# Patient Record
Sex: Male | Born: 1940 | Race: White | Hispanic: No | Marital: Married | Smoking: Former smoker
Health system: Southern US, Community
[De-identification: ages and names within clinical notes are randomized; demographics above are authoritative.]

## PROBLEM LIST (undated history)

## (undated) DIAGNOSIS — E785 Hyperlipidemia, unspecified: Secondary | ICD-10-CM

## (undated) DIAGNOSIS — K219 Gastro-esophageal reflux disease without esophagitis: Secondary | ICD-10-CM

## (undated) DIAGNOSIS — E559 Vitamin D deficiency, unspecified: Secondary | ICD-10-CM

## (undated) DIAGNOSIS — I10 Essential (primary) hypertension: Secondary | ICD-10-CM

## (undated) HISTORY — DX: Vitamin D deficiency, unspecified: E55.9

## (undated) HISTORY — PX: TONSILLECTOMY AND ADENOIDECTOMY: SUR1326

## (undated) HISTORY — DX: Essential (primary) hypertension: I10

## (undated) HISTORY — DX: Hyperlipidemia, unspecified: E78.5

## (undated) HISTORY — DX: Gastro-esophageal reflux disease without esophagitis: K21.9

## (undated) HISTORY — PX: APPENDECTOMY: SHX54

---

## 2000-01-12 ENCOUNTER — Ambulatory Visit (HOSPITAL_COMMUNITY): Admission: RE | Admit: 2000-01-12 | Discharge: 2000-01-12 | Payer: Self-pay | Admitting: Internal Medicine

## 2000-01-12 ENCOUNTER — Encounter: Payer: Self-pay | Admitting: Internal Medicine

## 2000-01-26 ENCOUNTER — Encounter (INDEPENDENT_AMBULATORY_CARE_PROVIDER_SITE_OTHER): Payer: Self-pay | Admitting: Specialist

## 2000-01-26 ENCOUNTER — Ambulatory Visit (HOSPITAL_COMMUNITY): Admission: RE | Admit: 2000-01-26 | Discharge: 2000-01-26 | Payer: Self-pay | Admitting: Internal Medicine

## 2001-08-07 ENCOUNTER — Encounter: Admission: RE | Admit: 2001-08-07 | Discharge: 2001-08-07 | Payer: Self-pay | Admitting: Family Medicine

## 2001-08-07 ENCOUNTER — Encounter: Payer: Self-pay | Admitting: Family Medicine

## 2006-01-13 ENCOUNTER — Ambulatory Visit (HOSPITAL_COMMUNITY): Admission: RE | Admit: 2006-01-13 | Discharge: 2006-01-13 | Payer: Self-pay | Admitting: Internal Medicine

## 2006-01-23 ENCOUNTER — Ambulatory Visit (HOSPITAL_COMMUNITY): Admission: RE | Admit: 2006-01-23 | Discharge: 2006-01-23 | Payer: Self-pay | Admitting: Internal Medicine

## 2006-04-27 ENCOUNTER — Ambulatory Visit (HOSPITAL_COMMUNITY): Admission: RE | Admit: 2006-04-27 | Discharge: 2006-04-27 | Payer: Self-pay | Admitting: Internal Medicine

## 2007-10-26 ENCOUNTER — Observation Stay (HOSPITAL_COMMUNITY): Admission: EM | Admit: 2007-10-26 | Discharge: 2007-10-27 | Payer: Self-pay | Admitting: Emergency Medicine

## 2007-11-22 LAB — HM COLONOSCOPY

## 2008-09-22 ENCOUNTER — Ambulatory Visit (HOSPITAL_COMMUNITY): Admission: RE | Admit: 2008-09-22 | Discharge: 2008-09-22 | Payer: Self-pay | Admitting: Internal Medicine

## 2008-11-21 HISTORY — PX: HERNIA REPAIR: SHX51

## 2009-02-13 ENCOUNTER — Ambulatory Visit (HOSPITAL_BASED_OUTPATIENT_CLINIC_OR_DEPARTMENT_OTHER): Admission: RE | Admit: 2009-02-13 | Discharge: 2009-02-13 | Payer: Self-pay | Admitting: Surgery

## 2009-06-21 IMAGING — CT CT CHEST W/O CM
3 series · 18 of 30 positions shown, 19 images · non-contrast
Comparison: 04/27/2006 and 01/23/2006

CLINICAL DATA: Followup right upper lobe nodule.  Post inflammatory
changes in the lungs.

CT CHEST WITHOUT CONTRAST
TECHNIQUE: Multidetector CT imaging of the chest was performed
following the standard protocol without IV contrast.

[Series 2: chest w/ · axial · 0.66mm/px · z∈[-280,-90]mm · 4 of 77 slices shown, 5 images]
[im 20/77  mediastinal]
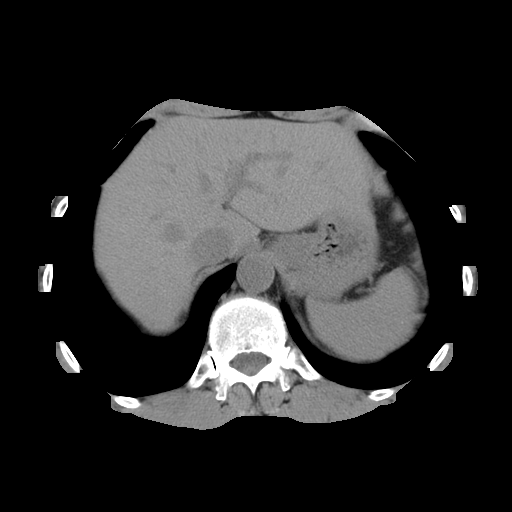
[im 20/77  lung]
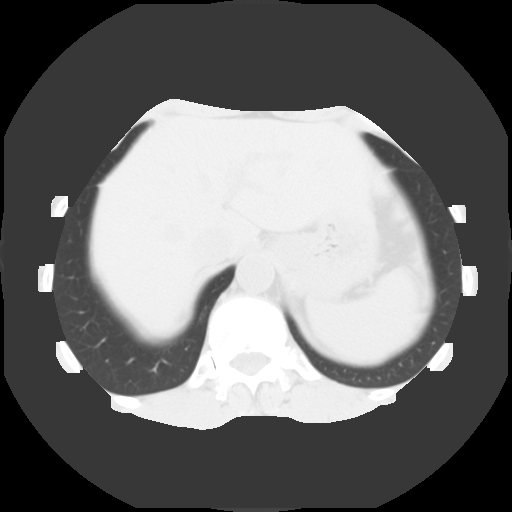
[im 38/77  lung]
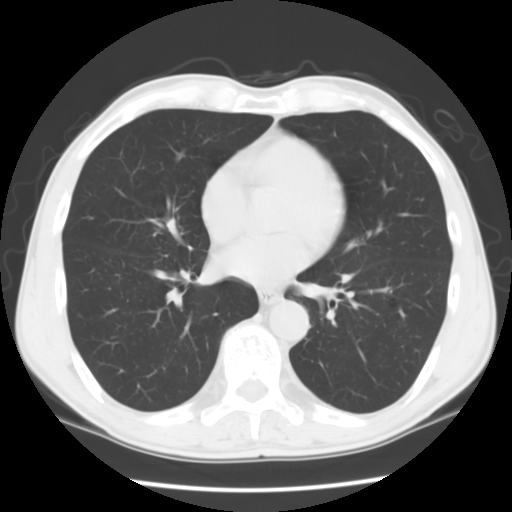
[im 39/77  lung]
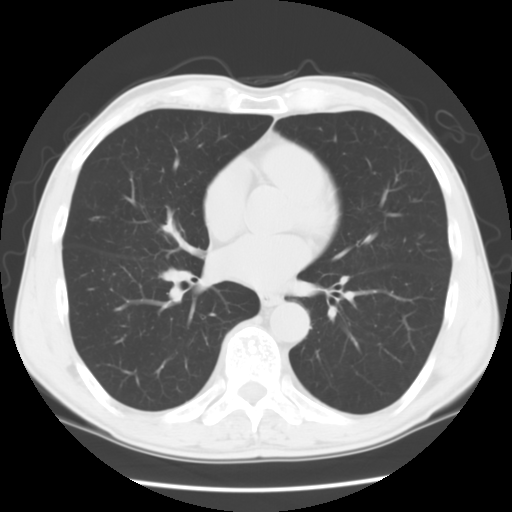
[im 58/77  lung]
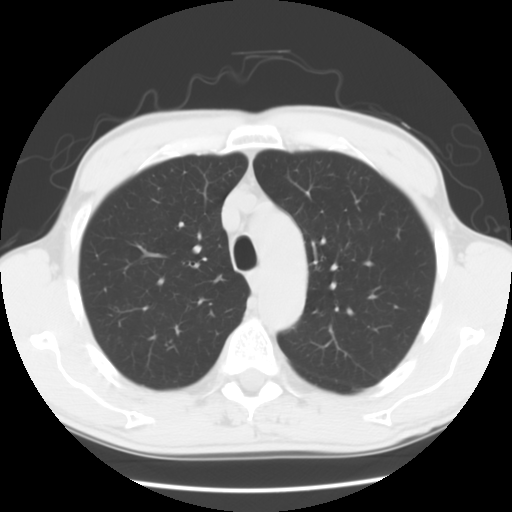

[Series 400: reformatted · coronal · 0.76mm/px · 6 of 148 slices shown (1 of 2)]
[im 15/148  lung]
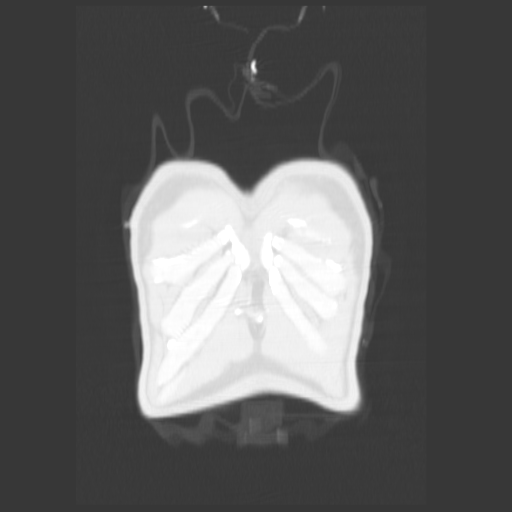
[im 30/148  lung]
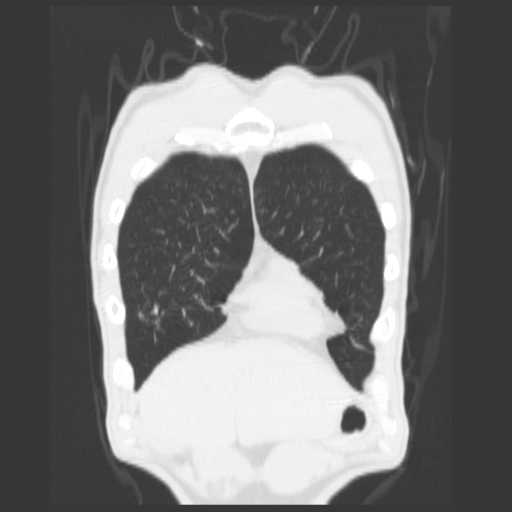
[im 45/148  lung]
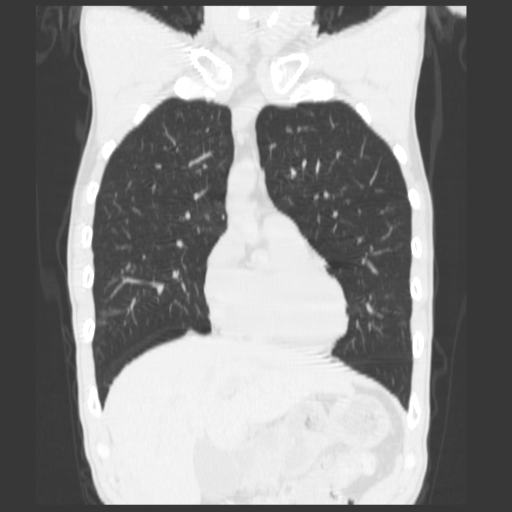
[im 59/148  lung]
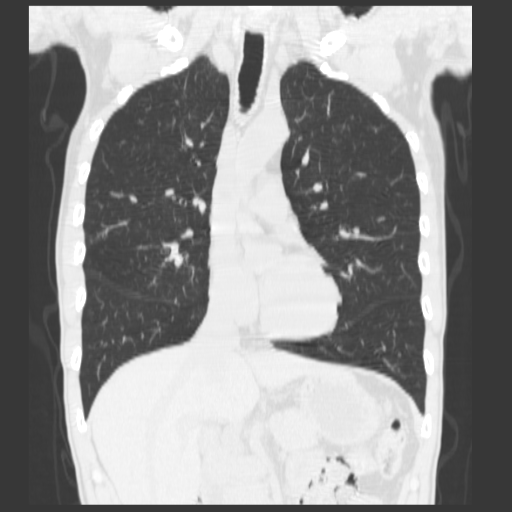
[im 89/148  lung]
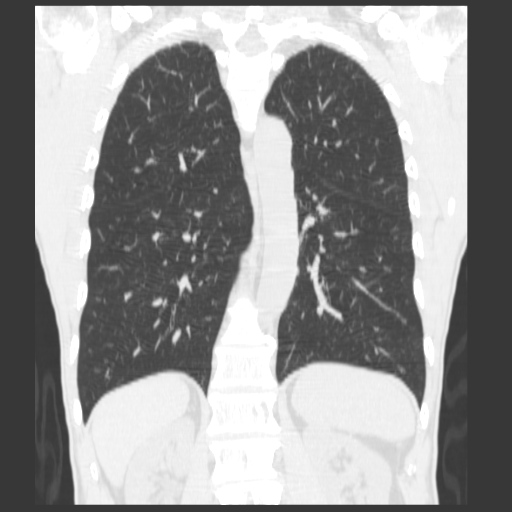
[im 103/148  lung]
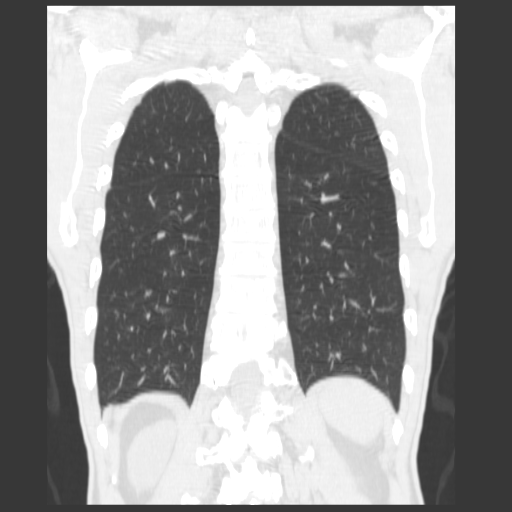

[Series 401: reformatted · sagittal · 0.76mm/px · 8 of 166 slices shown (2 of 2)]
[im 16/166  lung]
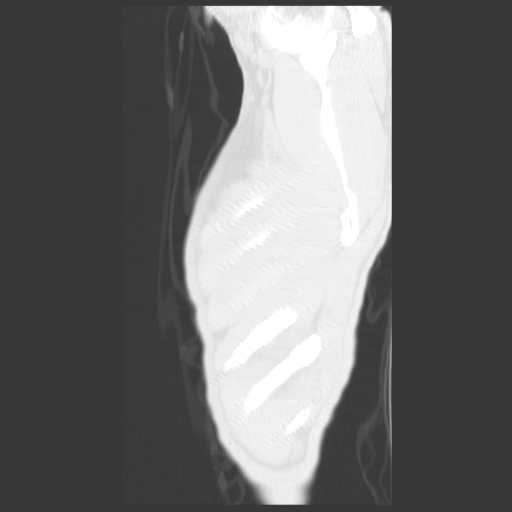
[im 46/166  lung]
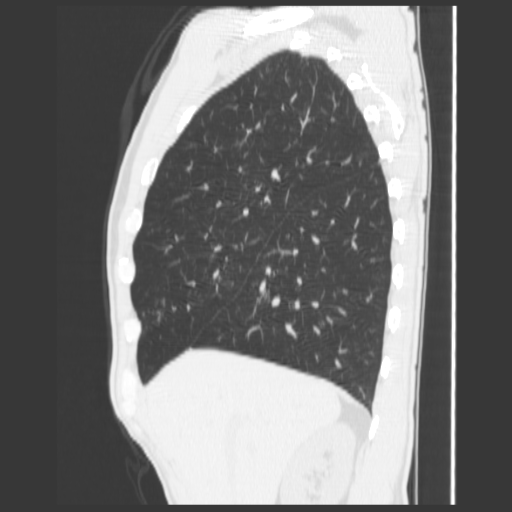
[im 61/166  lung]
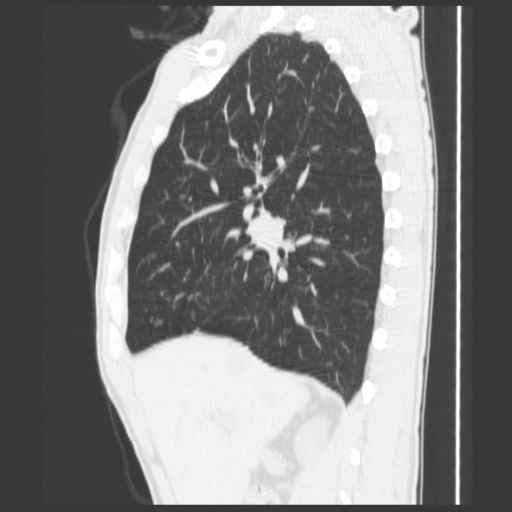
[im 76/166  lung]
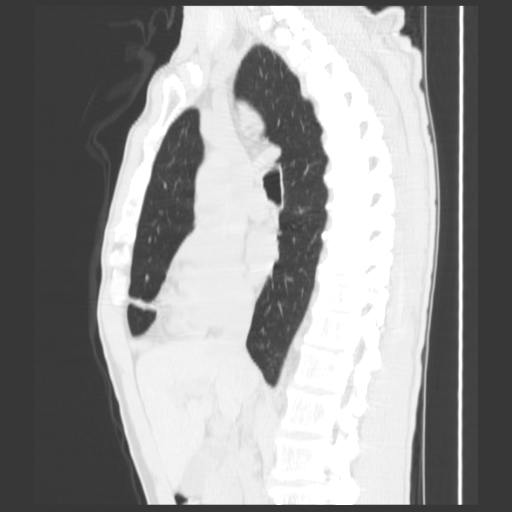
[im 91/166  lung]
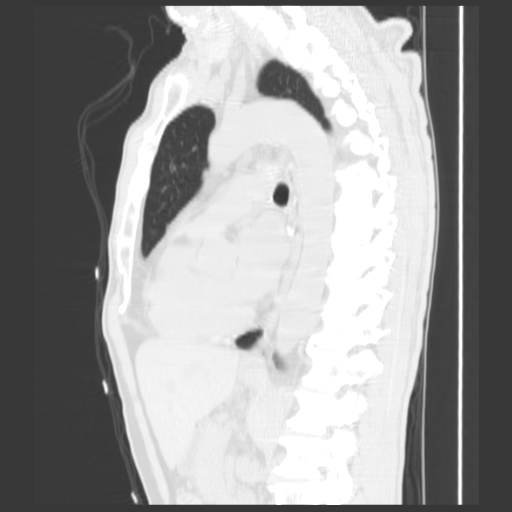
[im 106/166  lung]
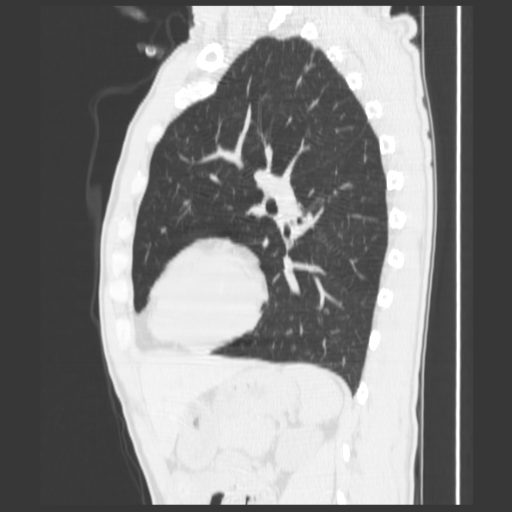
[im 121/166  lung]
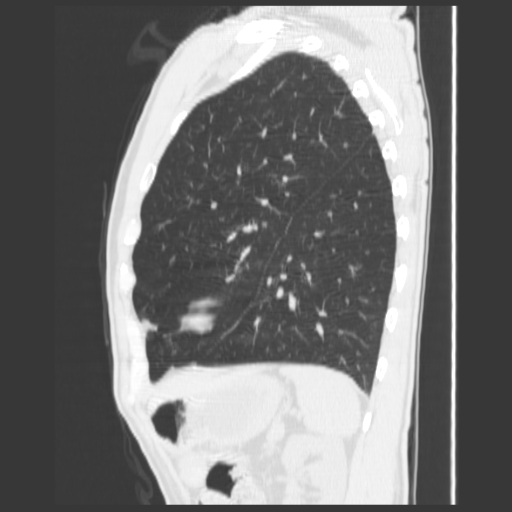
[im 151/166  lung]
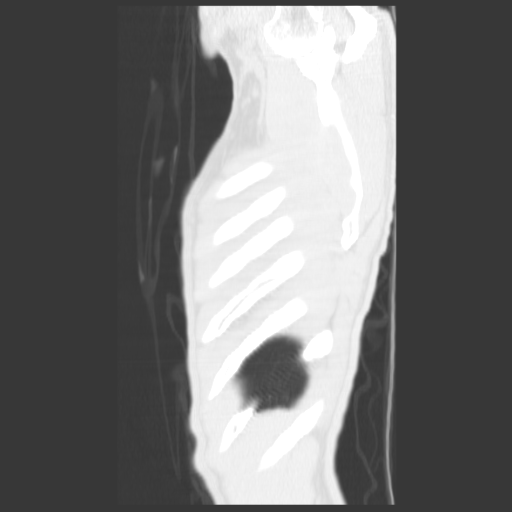

[18 of 30 positions shown; findings below may reference images not displayed]

FINDINGS: No pathologically enlarged mediastinal, hilar or axillary
lymph nodes.  Heart size normal.  No pericardial effusion.

Mild biapical scarring.  Scarring in the right middle lobe and
posterior segment right upper lobe is likely post infectious in
etiology.  Appearance is stable.  Lungs otherwise clear.  No
pleural fluid.  Airway unremarkable.

Incidental imaging of the upper abdomen shows no acute findings.
No worrisome lytic or sclerotic lesions.
IMPRESSION: 1.  No acute findings. No pulmonary nodules.
2.  Scar in the right upper and right middle lobe is likely post
infectious in etiology.

## 2011-03-03 LAB — POCT HEMOGLOBIN-HEMACUE: Hemoglobin: 14.9 g/dL (ref 13.0–17.0)

## 2011-04-05 NOTE — Op Note (Signed)
NAMEMALCOMB, GANGEMI              ACCOUNT NO.:  [id number]   MEDICAL RECORD NO.:  [id number]          PATIENT TYPE:  AMB   LOCATION:  DSC                          FACILITY:  [facility]   PHYSICIAN:  Thornton Park. Daphine Deutscher, MD  DATE OF BIRTH:  [birth year]   DATE OF PROCEDURE:  02/13/2009  DATE OF DISCHARGE:                               OPERATIVE REPORT   PREOPERATIVE DIAGNOSIS:  A 70 year old man with a right inguinal hernia.   POSTOPERATIVE DIAGNOSIS:  Right indirect inguinal hernia.   PROCEDURE:  Right inguinal herniorrhaphy with UltraPro II mesh.   SURGEON:  Thornton Park. Daphine Deutscher, MD   ANESTHESIA:  General by LMA.   DESCRIPTION OF PROCEDURE:  Mr. Mullane was taken to room 8 on Friday,  February 13, 2009 and given general anesthesia.  The abdomen was prepped  with Technicare equivalent after being clipped and was draped sterilely.  A small oblique incision was made and carried down to the external  oblique.  These were incised along the fibers.  The cord was mobilized  and a Penrose drain placed about it.  I dissected free a fairly  prominent indirect sac and I then performed a high ligation with 2-0  silk.  Vascular structures to the testicle were not disturbed and no  neurovascular bundles were transected that could be identified.  The  floor was then repaired with a piece of UltraPro II mesh sutured along  the inguinal ligament and medially with again 2-0 Prolene.  It was then  sutured to itself around the cord and tucked beneath the external  oblique.  The external oblique was closed with running 2-0 Vicryl.  The  patient tolerated the procedure well.  The wound was subsequently closed  with 4-0 Vicryl with a running subcuticular 5-0 Monocryl with Dermabond.  The patient was taken to the recovery room.  He will be given Tylox to  take for pain with followup in the office in 3-4 weeks.      Thornton Park Daphine Deutscher, MD  Electronically Signed     MBM/MEDQ  D:  02/13/2009  T:  02/14/2009   Job:  [id number]   cc:   Lovenia Kim, D.O.

## 2011-04-05 NOTE — H&P (Signed)
Carlos Green, Carlos Green              ACCOUNT NO.:  [id number]   MEDICAL RECORD NO.:  [id number]          PATIENT TYPE:  EMS   LOCATION:  MAJO                         FACILITY:  [facility]   PHYSICIAN:  Lonia Blood, M.D.       DATE OF BIRTH:  [birth year]   DATE OF ADMISSION:  10/26/2007  DATE OF DISCHARGE:                              HISTORY & PHYSICAL   PRIMARY CARE PHYSICIAN:  Lovenia Kim, D.O.   CHIEF COMPLAINT:  Chest pain.   HISTORY OF PRESENT ILLNESS:  Mr. Morina is a 70 year old gentleman with  a past medical history esophagitis, who came today to the emergency room  after he had an episode of 10/10 epigastric/inferior retrosternal chest  pain that was constant burning-like sensation.  He vomited promptly  coffee-ground-appearing emesis and then the pain got better.  The  patient denies any sweating or shortness of breath when the episode  occurred.  He reports that the pain did not radiate.  The pain was worse  with deep breathing.  The patient carries no personal history of  coronary artery disease and he does not have a strong family history of  early coronary disease.  The patient does not have diabetes and he does  not smoke cigarettes.   PAST MEDICAL HISTORY:  Significant for some esophagitis, hemorrhoids,  inguinal hernia and appendectomy.   HOME MEDICATIONS:  None.   DRUG ALLERGIES:  No known drug allergies.   SOCIAL HISTORY:  The patient is unemployed.  He drinks 4-5 cups of  coffee a day.  He has a remote history of alcohol use, quit 40 years  ago.  Remote history of tobacco use, quit 15 years ago.  The patient is  married and has two children.   FAMILY HISTORY:  Positive for alcoholism in his father, positive asthma  in mother.  Both parents are deceased.  The patient has two brothers.  They are both healthy.   REVIEW OF SYSTEMS:  Positive for the nausea and the vomiting.  Positive  for one loose bowel movement.  The patient also reports some blurry  vision.   Also other systems are per HPI.  All other systems reviewed and  negative.   PHYSICAL EXAM UPON ADMISSION:  Temperature 97.6, pulse 60, respiration  16, blood pressure is 149/85.  GENERAL APPEARANCE:  A thin, elderly male in no acute distress.  He is  alert, oriented to place, person and time.  Head appears normocephalic, atraumatic.  Eyes:  Pupils equal, round,  react to light and accommodation.  Extraocular movements intact.  The  patient has alopecia areata.  NECK:  Supple.  No JVD.  No carotid bruits.  RESPIRATORY:  Clear to auscultation bilaterally without wheezes, rhonchi  or crackles.  HEART:  Regular rate and rhythm without murmurs, rubs or gallops.  ABDOMEN:  Soft, nontender, nondistended.  Bowel sounds are present.  Reducible right inguinal hernia.  GENITOURINARY:  The patient has no CVA tenderness.  EXTREMITIES:  Without edema.  SKIN:  Warm, dry, without any suspicious rashes.  NEUROLOGIC:  Cranial nerves III-XII are intact.  Sensation is  intact  strength 5/5 in all four extremities.  Gait is intact.   LABORATORY VALUES AT TIME OF ADMISSION:  White blood cell count is 6.6,  hemoglobin is 14.5, platelet count is 170.  Sodium is 138, potassium  4.3, chloride 103, bicarb 28, BUN 11, creatinine 1, glucose 110.  Urinalysis within normal limits.  EKG shows sinus bradycardia and no ST-  T changes.  Chest x-ray:  Some emphysema.  No active disease.   ASSESSMENT/PLAN:  1. Chest pain, unclear etiology but that we would favor      gastrointestinal etiology given history of esophagitis and episode      of nausea and vomiting.  The patient, though, is at the right age      and he has risk factors for coronary disease because he is a man      and because of his age.  The cholesterol status is unknown.  The      patient has a remote history of tobacco use but does not have a      strong family history of coronary artery disease.  First set of      cardiac enzymes is normal.  An EKG  does not have worrisome      findings.  The patient merits 23 hours' observation.  Telemetric      monitoring will be obtained.  A fasting lipid panel will be      obtained.  Given the patient's bradycardia, thyroid status will be      assessed.  Proton pump inhibitors will be used to treat the patient      and aspirin for coronary prevention.  Depending on the observation      over the next 23 hours, the patient will have either an outpatient      stress test versus inpatient evaluation.  A trial of proton pump      inhibitors will be done regardless of the results of the cardiac      workup.      Lonia Blood, M.D.  Electronically Signed     SL/MEDQ  D:  10/26/2007  T:  10/27/2007  Job:  [id number]   cc:   Lovenia Kim, D.O.

## 2011-04-08 NOTE — Discharge Summary (Signed)
NAMEJERIKO, KOWALKE              ACCOUNT NO.:  [id number]   MEDICAL RECORD NO.:  [id number]          PATIENT TYPE:  OBV   LOCATION:  3733                         FACILITY:  [facility]   PHYSICIAN:  Lonia Blood, M.D.       DATE OF BIRTH:  21-Oct-1941   DATE OF ADMISSION:  10/26/2007  DATE OF DISCHARGE:  10/27/2007                               DISCHARGE SUMMARY   PRIMARY CARE PHYSICIAN:  Dr. Marisue Brooklyn.   DISCHARGE DIAGNOSES:  1. Chest pain - unclear etiology.  2. Gastroesophageal reflux disease.  3. Esophagitis.   DISCHARGE MEDICATIONS:  1. Aspirin 81 mg daily.  2. Prilosec OTC 40 mg daily.   CONDITION ON DISCHARGE:  Mr. Grygiel was discharged in good condition.  The patient was instructed to follow up with his primary care physician  as needed.  The patient was also referred to the [facility] for constellation of an outpatient stress test.  The patient was  told that he would be contacted by the office, to schedule a visit with  [facility].   PROCEDURES DURING THIS ADMISSION:  October 26, 2007, the patient  underwent chest x-ray findings of COPD and no active disease.   CONSULTATION:  During this admission, no consultations obtained.   HISTORY AND PHYSICAL:  For admission history and physical, refer to  dictated H&P done by Dr. Lavera Guise, October 26, 2007.   HOSPITAL COURSE:  1. Chest pain.  Mr. Lange was admitted through the emergency room      with complaints of retrosternal chest pain.  The history indicated      the possibility of a gastrointestinal etiology.  Despite this,      given the patient's age, we elected to observe him overnight to      rule out possibility of unstable angina/myocardial infarction.  Mr.      Cancio      remained without any chest pain through the night, without      arrhythmias on EKG, and had three sets of normal cardiac enzymes.      The patient was instructed to resume his proton pump inhibitor, and      he was advised  to follow up with cardiology as an outpatient for      stress test.      Lonia Blood, M.D.  Electronically Signed     SL/MEDQ  D:  11/01/2007  T:  11/01/2007  Job:  [id number]   cc:   Lovenia Kim, D.O.

## 2011-04-08 NOTE — Procedures (Signed)
[name]  Patient:    Carlos Green, Carlos Green                     MRN: [id number] Adm. Date:  [id number] Attending:  Mervin Hack CC:         Carlos Green, M.D. [facility]                           Procedure Report  PROCEDURE PERFORMED:  Upper endoscopy and colonoscopy  INDICATION FOR PROCEDURE:  This 70 year old gentleman has complained of epigastric pain, belching, burning and symptoms from gastroesophageal reflux.  He also has had intermittent bright red blood per rectum and described tarry stools as well for  past two to three years. He has had rectal bleeding up until this morning when e prepped for colonoscopy.  On physical examination on January 12, 2000 he had tenderness in the epigastrium. His hemoglobin was 15.9.  He has no family history of colon cancer.  UPPER ENDOSCOPY  ENDOSCOPE:  Fujinon single channel videoscope  SEDATIONS:  Versed 8 mg IV  FINDINGS: Esophagus: Fujinon single channel videoendoscope passed under direction through posterior pharynx into esophagus.  The patient was monitored by pulse oximetry.  His oxygen saturations were normal.  Proximal mid esophagus was normal. Distal esophageal mucosa was patchy showing increased fibrosis as well as erythema but there were no discreet erosions.  There was a demarcation at the Z line showed mild increase of the fibrous tissue and somewhat irregular Z line.  Biopsies were taken from this area to rule out Barretts esophagus.  There was a small reducible hiatal hernia.  Stomach:  Stomach was insufflated with air and showed normal gastric mucosa, antrum as well as body of the stomach.  Pyloric acid was also normal.  Retroflexion of the endoscope revealed normal fundus and cardia.  Duodenum: Duodenal bulb and descending duodenum are normal.  IMPRESSION: 1. Distal esophagitis status post biopsies. 2. Status post CLOtest.  COLONOSCOPY  ENDOSCOPE:  Fujinon single  channel videoscope  SEDATION: Demerol 75 mg IV  FINDINGS:  Fujinon single channel videoendoscope passed through rectum into sigmoid colon.  The patient was again monitored by pulse oximetry.  His oxygen saturations were normal. His prep was excellent.  Anal canal was normal.  There were large internal hemorrhoids especially one them showed ulceration with bleeding. These were visualized by retroflexing the colonoscope into the rectum.  Sigmoid colon  mucosa appeared normal. There were no diverticula.  Mucosal folds were normal. Mucosa throughout the left colon was unremarkable.  Splenic flexure, transverse  colon and hepatic flexure was unremarkable.  Colonoscope passed with ease through the right colon to cecum, cecal valve were normal.  Photographs of the ileocecal valve and cecal patch were obtained.  The colonoscope was then retracted. The colon was decompressed.  The patient tolerated the procedure well.  IMPRESSION:  Bleeding hemorrhoids.  PLAN: As far as his epigastric pain is concerned the patient is showing signs of chronic gastroesophageal reflux.  We will increase his Prevacid to 30 mg twice  day.  Await results of the biopsies and the CLOtest.  His hemorrhoids will be treated again conservatively with Anusol suppositories twice a day, high fiber iet and rectal care.  If the bleeding continues, he may need banding or injection of the hemorrhoids. DD:  01/26/00 TD:  01/27/00 Job: [id number]

## 2011-08-29 LAB — URINALYSIS, ROUTINE W REFLEX MICROSCOPIC
Hgb urine dipstick: NEGATIVE
Ketones, ur: NEGATIVE
Nitrite: NEGATIVE
Protein, ur: NEGATIVE
Specific Gravity, Urine: 1.005
Urobilinogen, UA: 0.2
pH: 7

## 2011-08-29 LAB — DIFFERENTIAL
Basophils Absolute: 0
Eosinophils Relative: 1
Lymphocytes Relative: 10 — ABNORMAL LOW
Monocytes Absolute: 0.3
Monocytes Relative: 4
Neutro Abs: 5.6
Neutrophils Relative %: 85 — ABNORMAL HIGH

## 2011-08-29 LAB — POCT CARDIAC MARKERS
CKMB, poc: 1.1
Myoglobin, poc: 73.2
Troponin i, poc: <0.05

## 2011-08-29 LAB — CBC
HCT: 43.3
MCHC: 33
MCV: 87.9
MCV: 89.5
Platelets: 170
RBC: 4.52
WBC: 5.4

## 2011-08-29 LAB — BASIC METABOLIC PANEL
BUN: 11
CO2: 26
Calcium: 8.6
Creatinine, Ser: 1.02
GFR calc Af Amer: >60
Glucose, Bld: 94
Potassium: 3.9

## 2011-08-29 LAB — COMPREHENSIVE METABOLIC PANEL
CO2: 28
Glucose, Bld: 110 — ABNORMAL HIGH
Potassium: 4.3
Total Bilirubin: 0.9

## 2011-08-29 LAB — LIPID PANEL: Triglycerides: 41

## 2011-08-29 LAB — D-DIMER, QUANTITATIVE: D-Dimer, Quant: <0.22

## 2011-08-29 LAB — TROPONIN I: Troponin I: 0.01

## 2011-08-29 LAB — CARDIAC PANEL(CRET KIN+CKTOT+MB+TROPI): Troponin I: 0.02

## 2013-10-21 ENCOUNTER — Encounter: Payer: Self-pay | Admitting: Physician Assistant

## 2013-10-24 ENCOUNTER — Encounter: Payer: Self-pay | Admitting: Physician Assistant

## 2013-10-24 ENCOUNTER — Ambulatory Visit (HOSPITAL_COMMUNITY)
Admission: RE | Admit: 2013-10-24 | Discharge: 2013-10-24 | Disposition: A | Discharge status: Home / Self Care | Payer: Medicare Other | Source: Ambulatory Visit | Attending: Physician Assistant | Admitting: Physician Assistant

## 2013-10-24 ENCOUNTER — Ambulatory Visit (INDEPENDENT_AMBULATORY_CARE_PROVIDER_SITE_OTHER): Payer: Medicare Other | Admitting: Physician Assistant

## 2013-10-24 VITALS — BP 122/78 | HR 56 | Temp 97.7°F | Resp 16 | Ht 66.0 in | Wt 142.0 lb

## 2013-10-24 DIAGNOSIS — K219 Gastro-esophageal reflux disease without esophagitis: Secondary | ICD-10-CM

## 2013-10-24 DIAGNOSIS — Z87891 Personal history of nicotine dependence: Secondary | ICD-10-CM | POA: Insufficient documentation

## 2013-10-24 DIAGNOSIS — E785 Hyperlipidemia, unspecified: Secondary | ICD-10-CM

## 2013-10-24 DIAGNOSIS — Z0000 Encounter for general adult medical examination without abnormal findings: Secondary | ICD-10-CM

## 2013-10-24 DIAGNOSIS — I10 Essential (primary) hypertension: Secondary | ICD-10-CM | POA: Insufficient documentation

## 2013-10-24 DIAGNOSIS — Z79899 Other long term (current) drug therapy: Secondary | ICD-10-CM

## 2013-10-24 DIAGNOSIS — E559 Vitamin D deficiency, unspecified: Secondary | ICD-10-CM

## 2013-10-24 DIAGNOSIS — Z125 Encounter for screening for malignant neoplasm of prostate: Secondary | ICD-10-CM

## 2013-10-24 DIAGNOSIS — R918 Other nonspecific abnormal finding of lung field: Secondary | ICD-10-CM | POA: Insufficient documentation

## 2013-10-24 LAB — CBC WITH DIFFERENTIAL/PLATELET
Basophils Absolute: 0.1 10*3/uL (ref 0.0–0.1)
Basophils Relative: 1 % (ref 0–1)
Eosinophils Absolute: 0.2 10*3/uL (ref 0.0–0.7)
Eosinophils Relative: 4 % (ref 0–5)
HCT: 43.6 % (ref 39.0–52.0)
Hemoglobin: 15 g/dL (ref 13.0–17.0)
Lymphocytes Relative: 22 % (ref 12–46)
Lymphs Abs: 1.1 10*3/uL (ref 0.7–4.0)
MCH: 29.2 pg (ref 26.0–34.0)
MCHC: 34.4 g/dL (ref 30.0–36.0)
MCV: 84.8 fL (ref 78.0–100.0)
Monocytes Relative: 12 % (ref 3–12)
Neutrophils Relative %: 61 % (ref 43–77)
WBC: 4.8 10*3/uL (ref 4.0–10.5)

## 2013-10-24 LAB — TSH: TSH: 3.783 u[IU]/mL (ref 0.350–4.500)

## 2013-10-24 LAB — BASIC METABOLIC PANEL WITH GFR
CO2: 25 mEq/L (ref 19–32)
Calcium: 9.7 mg/dL (ref 8.4–10.5)
Creat: 0.82 mg/dL (ref 0.50–1.35)
Sodium: 138 mEq/L (ref 135–145)

## 2013-10-24 LAB — HEPATIC FUNCTION PANEL
ALT: 11 U/L (ref 0–53)
Indirect Bilirubin: 0.4 mg/dL (ref 0.0–0.9)

## 2013-10-24 LAB — LIPID PANEL
Cholesterol: 208 mg/dL — ABNORMAL HIGH (ref 0–200)
LDL Cholesterol: 128 mg/dL — ABNORMAL HIGH (ref 0–99)
Total CHOL/HDL Ratio: 3.1 Ratio

## 2013-10-24 LAB — MAGNESIUM: Magnesium: 2 mg/dL (ref 1.5–2.5)

## 2013-10-24 NOTE — Progress Notes (Signed)
Complete Physical HPI Patient presents for complete physical.   Patient's blood pressure has been controlled at home.  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 Patient denies chest pain, shortness of breath, dizziness. Patient's cholesterol is diet controlled. The patient's cholesterol last visit was LDL 103. PSA was 0.67. Vitamin D was 53 and not on a supplement.  Current Medications:  Current Outpatient Prescriptions on File Prior to Visit  Medication Sig Dispense Refill  . vitamin B-12 (CYANOCOBALAMIN) 1000 MCG tablet Take 1,000 mcg by mouth daily.      . vitamin C (ASCORBIC ACID) 500 MG tablet Take 500 mg by mouth daily.       No current facility-administered medications on file prior to visit.   Health Maintenance:  Tetanus: 09/2011 Pneumovax:01/12/2006 Flu vaccine: declines Zostavax: declines DEXA: declines Colonoscopy: 2009 due 2019 EGD: 2009 Allergies: No Known Allergies Medical History:  Past Medical History  Diagnosis Date  . Hyperlipidemia   . GERD (gastroesophageal reflux disease)     esophagitis EGD 2009  . Vitamin D deficiency    Surgical History:  Past Surgical History  Procedure Laterality Date  . Appendectomy    . Hernia repair Right 2010    inguinal  . Tonsillectomy and adenoidectomy     Family History:  Family History  Problem Relation Age of Onset  . Diabetes Mother   . Stroke Mother   . Hypertension Father   . Cancer Sister     breast   Social History:  History   Social History  . Marital Status: Married    Spouse Name: N/A    Number of Children: N/A  . Years of Education: N/A   Occupational History  . Not on file.   Social History Main Topics  . Smoking status: Former Smoker    Quit date: 10/22/1991  . Smokeless tobacco: Never Used  . Alcohol Use: No  . Drug Use: No  . Sexual Activity: Not Currently   Other Topics Concern  . Not on file   Social History Narrative  . No narrative on file    ROS Constitutional: Denies weight loss/gain, headaches, insomnia, fatigue, night sweats, and change in appetite. Eyes: Denies redness, blurred vision, diplopia, discharge, itchy, watery eyes.  ENT: Denies discharge, congestion, post nasal drip, sore throat, earache, hearing loss, dental pain, Tinnitus, Vertigo, Sinus pain, snoring.  Cardio: Denies chest pain, palpitations, irregular heartbeat, dyspnea, diaphoresis, orthopnea, PND, claudication, edema Respiratory: denies cough, dyspnea, pleurisy, hoarseness, wheezing.  Gastrointestinal: (Dr. Juanda Chance) Denies dysphagia, heartburn, pain, cramps, nausea, vomiting, bloating, diarrhea, constipation, hematemesis, melena, hematochezia, hemorrhoids Genitourinary: (Dr. Daphine Deutscher hernia repair) Denies dysuria, frequency, urgency, nocturia, hesitancy, discharge, hematuria, flank pain Breast:Denies Breast lumps, nipple discharge, bleeding.  Musculoskeletal: Denies arthralgia, myalgia, stiffness, Jt. Swelling, pain, Skin: Denies pruritis, rash, hives,  acne, eczema, changing in skin lesion Neuro: Denies Weakness, tremor, incoordination, spasms, paresthesia, pain Psychiatric: Denies confusion, memory loss, sensory loss Endocrine: Denies change in weight, skin, hair change, nocturia, and paresthesia, Diabetic Denies Polys, visual blurring, hyper /hypo glycemic episodes.  Heme/Lymph: Denies Excessive bleeding, bruising, enlarged lymph nodes  Physical Exam: Estimated body mass index is 22.93 kg/(m^2) as calculated from the following:   Height as of this encounter: 5\' 6"  (1.676 m).   Weight as of this encounter: 142 lb (64.411 kg). Filed Vitals:   10/24/13 1012  BP: 122/78  Pulse: 56  Temp: 97.7 F (36.5 C)  Resp: 16   General Appearance: Barrel chest,  slightly malnurished , in no apparent distress. Eyes: PERRLA, EOMs, conjunctiva no swelling or erythema, normal fundi and vessels. Sinuses: No Frontal/maxillary tenderness ENT/Mouth: Ext aud canals clear,  normal light reflex with TMs without erythema, bulging.  Good dentition. No erythema, swelling, or exudate on post pharynx. Tonsils not swollen or erythematous. Hearing normal.  Neck: Supple, thyroid normal. No bruits Respiratory: Respiratory effort normal, BS equal bilaterally without rales, rhonchi, wheezing or stridor. Cardio: Heart sounds normal, regular rate and rhythm without murmurs, rubs or gallops. Peripheral pulses brisk and equal bilaterally, without edema.  Chest: symmetric, with normal excursions and percussion. Abdomen: Flat, soft, with bowl sounds. Non tender, no guarding, rebound, hernias, masses, or organomegaly. .  Lymphatics: Non tender without lymphadenopathy.  Genitourinary: Defer Musculoskeletal: Full ROM all peripheral extremities,5/5 strength, and normal gait. Skin: Warm, dry without rashes, lesions, ecchymosis.  Neuro: Cranial nerves intact, reflexes equal bilaterally. Normal muscle tone, no cerebellar symptoms. Sensation intact.  Psych: Awake and oriented X 3, normal affect, Insight and Judgment appropriate.   EKG: RRR at 56bmp, LAE, PRWP, IRBBB no ST changes.   Assessment and Plan: 1. Hyperlipidemia Continue diet and exercise - Lipid panel  2. GERD (gastroesophageal reflux disease) Controlled diet  3. Vitamin D deficiency Not on supplement check level - Vit D  25 hydroxy (rtn osteoporosis monitoring)  4. Hypertension - CBC with Differential - BASIC METABOLIC PANEL WITH GFR - Hepatic function panel - TSH - Urinalysis, Routine w reflex microscopic - Microalbumin / creatinine urine ratio - EKG 12-Lead - [place], RETROPERITNL ABD,  LTD - DG Chest 2 View; Future- former smoker  5. Special screening for malignant neoplasm of prostate - PSA  6. Encounter for long-term (current) use of other medications - Magnesium    Carlos Green 10:29 AM

## 2013-10-24 NOTE — Patient Instructions (Signed)
Fat and Cholesterol Control Diet  Fat and cholesterol levels in your blood and organs are influenced by your diet. High levels of fat and cholesterol may lead to diseases of the heart, small and large blood vessels, gallbladder, liver, and pancreas.  CONTROLLING FAT AND CHOLESTEROL WITH DIET  Although exercise and lifestyle factors are important, your diet is key. That is because certain foods are known to raise cholesterol and others to lower it. The goal is to balance foods for their effect on cholesterol and more importantly, to replace saturated and trans fat with other types of fat, such as monounsaturated fat, polyunsaturated fat, and omega-3 fatty acids.  On average, a person should consume no more than 15 to 17 g of saturated fat daily. Saturated and trans fats are considered "bad" fats, and they will raise LDL cholesterol. Saturated fats are primarily found in animal products such as meats, butter, and cream. However, that does not mean you need to give up all your favorite foods. Today, there are good tasting, low-fat, low-cholesterol substitutes for most of the things you like to eat. Choose low-fat or nonfat alternatives. Choose round or loin cuts of red meat. These types of cuts are lowest in fat and cholesterol. Chicken (without the skin), fish, veal, and ground turkey breast are great choices. Eliminate fatty meats, such as hot dogs and salami. Even shellfish have little or no saturated fat. Have a 3 oz (85 g) portion when you eat lean meat, poultry, or fish.  Trans fats are also called "partially hydrogenated oils." They are oils that have been scientifically manipulated so that they are solid at room temperature resulting in a longer shelf life and improved taste and texture of foods in which they are added. Trans fats are found in stick margarine, some tub margarines, cookies, crackers, and baked goods.   When baking and cooking, oils are a great substitute for butter. The monounsaturated oils are  especially beneficial since it is believed they lower LDL and raise HDL. The oils you should avoid entirely are saturated tropical oils, such as coconut and palm.   Remember to eat a lot from food groups that are naturally free of saturated and trans fat, including fish, fruit, vegetables, beans, grains (barley, rice, couscous, bulgur wheat), and pasta (without cream sauces).   IDENTIFYING FOODS THAT LOWER FAT AND CHOLESTEROL   Soluble fiber may lower your cholesterol. This type of fiber is found in fruits such as apples, vegetables such as broccoli, potatoes, and carrots, legumes such as beans, peas, and lentils, and grains such as barley. Foods fortified with plant sterols (phytosterol) may also lower cholesterol. You should eat at least 2 g per day of these foods for a cholesterol lowering effect.   Read package labels to identify low-saturated fats, trans fat free, and low-fat foods at the supermarket. Select cheeses that have only 2 to 3 g saturated fat per ounce. Use a heart-healthy tub margarine that is free of trans fats or partially hydrogenated oil. When buying baked goods (cookies, crackers), avoid partially hydrogenated oils. Breads and muffins should be made from whole grains (whole-wheat or whole oat flour, instead of "flour" or "enriched flour"). Buy non-creamy canned soups with reduced salt and no added fats.   FOOD PREPARATION TECHNIQUES   Never deep-fry. If you must fry, either stir-fry, which uses very little fat, or use non-stick cooking sprays. When possible, broil, bake, or roast meats, and steam vegetables. Instead of putting butter or margarine on vegetables, use lemon   and herbs, applesauce, and cinnamon (for squash and sweet potatoes). Use nonfat yogurt, salsa, and low-fat dressings for salads.   LOW-SATURATED FAT / LOW-FAT FOOD SUBSTITUTES  Meats / Saturated Fat (g)  · Avoid: Steak, marbled (3 oz/85 g) / 11 g  · Choose: Steak, lean (3 oz/85 g) / 4 g  · Avoid: Hamburger (3 oz/85 g) / 7  g  · Choose: Hamburger, lean (3 oz/85 g) / 5 g  · Avoid: Ham (3 oz/85 g) / 6 g  · Choose: Ham, lean cut (3 oz/85 g) / 2.4 g  · Avoid: Chicken, with skin, dark meat (3 oz/85 g) / 4 g  · Choose: Chicken, skin removed, dark meat (3 oz/85 g) / 2 g  · Avoid: Chicken, with skin, light meat (3 oz/85 g) / 2.5 g  · Choose: Chicken, skin removed, light meat (3 oz/85 g) / 1 g  Dairy / Saturated Fat (g)  · Avoid: Whole milk (1 cup) / 5 g  · Choose: Low-fat milk, 2% (1 cup) / 3 g  · Choose: Low-fat milk, 1% (1 cup) / 1.5 g  · Choose: Skim milk (1 cup) / 0.3 g  · Avoid: Hard cheese (1 oz/28 g) / 6 g  · Choose: Skim milk cheese (1 oz/28 g) / 2 to 3 g  · Avoid: Cottage cheese, 4% fat (1 cup) / 6.5 g  · Choose: Low-fat cottage cheese, 1% fat (1 cup) / 1.5 g  · Avoid: Ice cream (1 cup) / 9 g  · Choose: Sherbet (1 cup) / 2.5 g  · Choose: Nonfat frozen yogurt (1 cup) / 0.3 g  · Choose: Frozen fruit bar / trace  · Avoid: Whipped cream (1 tbs) / 3.5 g  · Choose: Nondairy whipped topping (1 tbs) / 1 g  Condiments / Saturated Fat (g)  · Avoid: Mayonnaise (1 tbs) / 2 g  · Choose: Low-fat mayonnaise (1 tbs) / 1 g  · Avoid: Butter (1 tbs) / 7 g  · Choose: Extra light margarine (1 tbs) / 1 g  · Avoid: Coconut oil (1 tbs) / 11.8 g  · Choose: Olive oil (1 tbs) / 1.8 g  · Choose: Corn oil (1 tbs) / 1.7 g  · Choose: Safflower oil (1 tbs) / 1.2 g  · Choose: Sunflower oil (1 tbs) / 1.4 g  · Choose: Soybean oil (1 tbs) / 2.4 g  · Choose: Canola oil (1 tbs) / 1 g  Document Released: 11/07/2005 Document Revised: 03/04/2013 Document Reviewed: 04/28/2011  [employer]® Patient Information ©2014 [employer].

## 2013-10-25 LAB — URINALYSIS, ROUTINE W REFLEX MICROSCOPIC
Bilirubin Urine: NEGATIVE
Glucose, UA: NEGATIVE mg/dL
Leukocytes, UA: NEGATIVE
Specific Gravity, Urine: 1.005 (ref 1.005–1.030)
Urobilinogen, UA: 0.2 mg/dL (ref 0.0–1.0)
pH: 6.5 (ref 5.0–8.0)

## 2013-10-25 LAB — MICROALBUMIN / CREATININE URINE RATIO
Creatinine, Urine: 27.3 mg/dL
Microalb Creat Ratio: 18.3 mg/g (ref 0.0–30.0)
Microalb, Ur: 0.5 mg/dL (ref 0.00–1.89)

## 2014-07-23 IMAGING — CR DG CHEST 2V
3 series · 3 of 3 positions shown · non-contrast
Comparison: 10/26/2007 and CT of the chest on 09/22/2008

CLINICAL DATA: Annual physical exam. History of smoking for 50
years.

EXAM:
CHEST  2 VIEW

[view not recorded (1 of 3)]
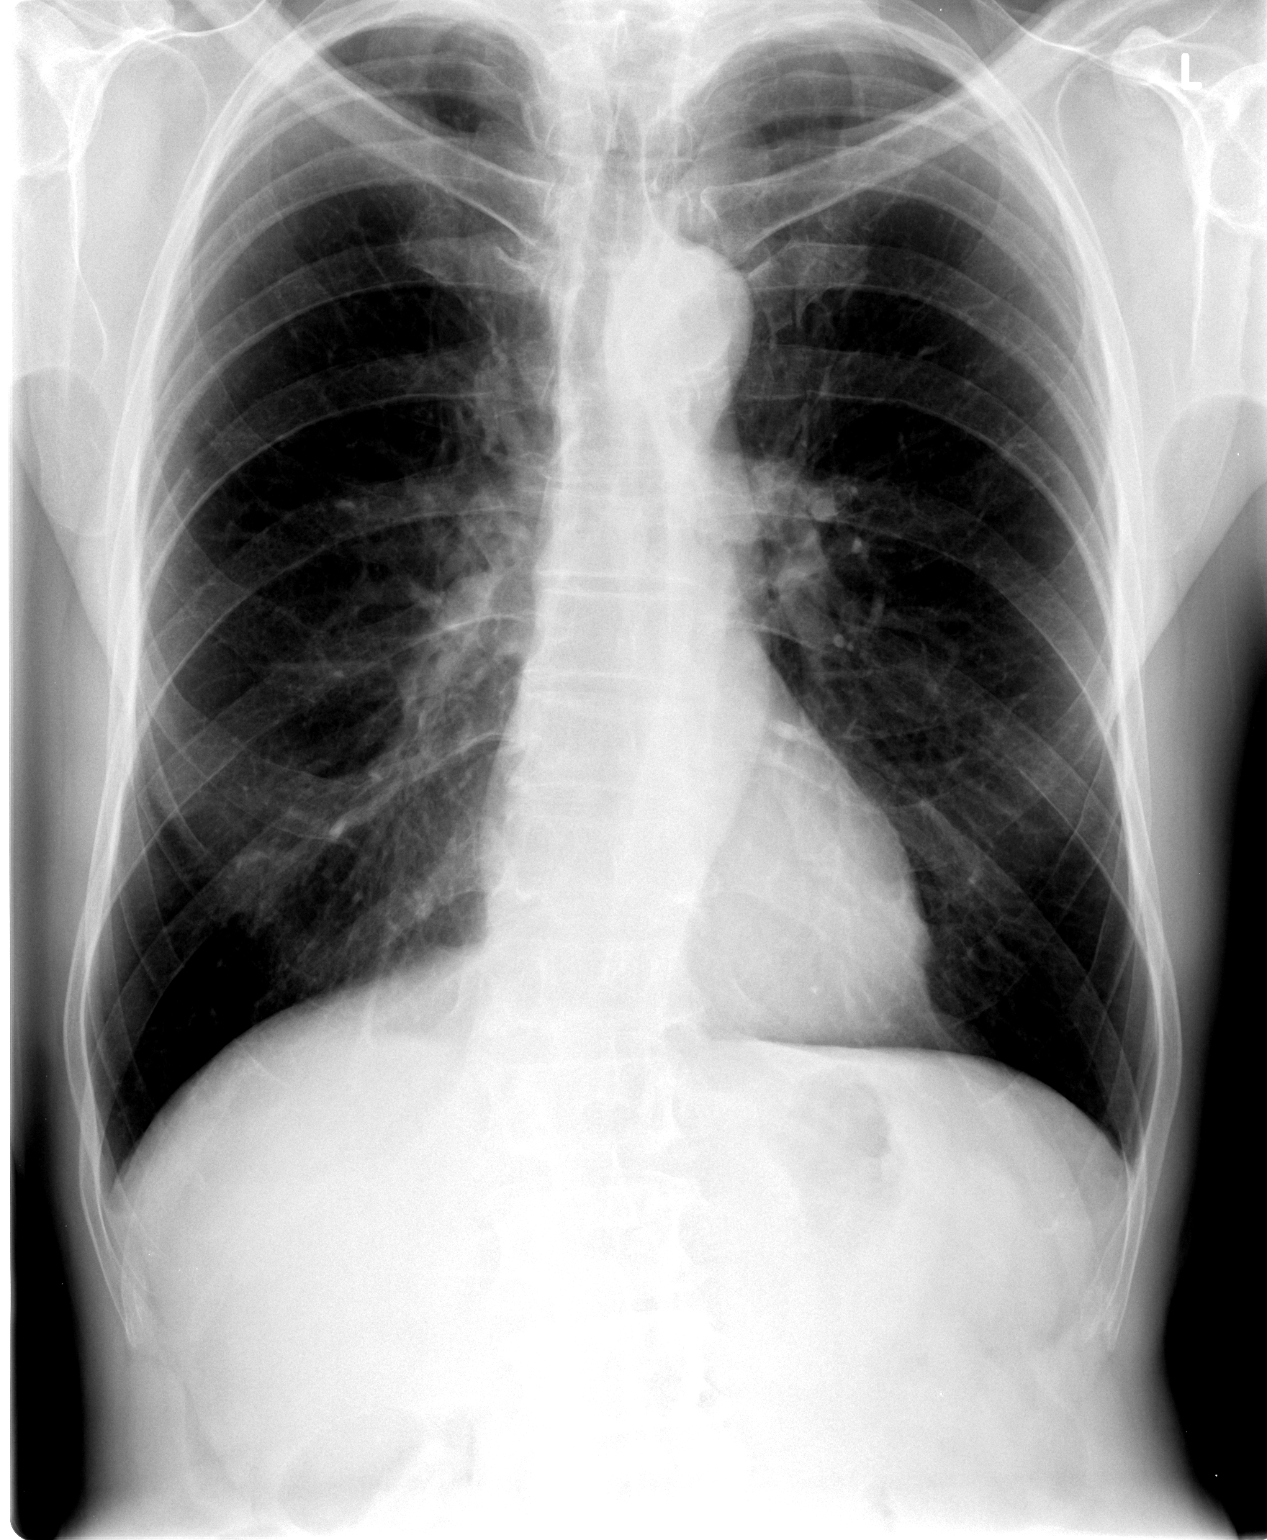

[view not recorded (2 of 3)]
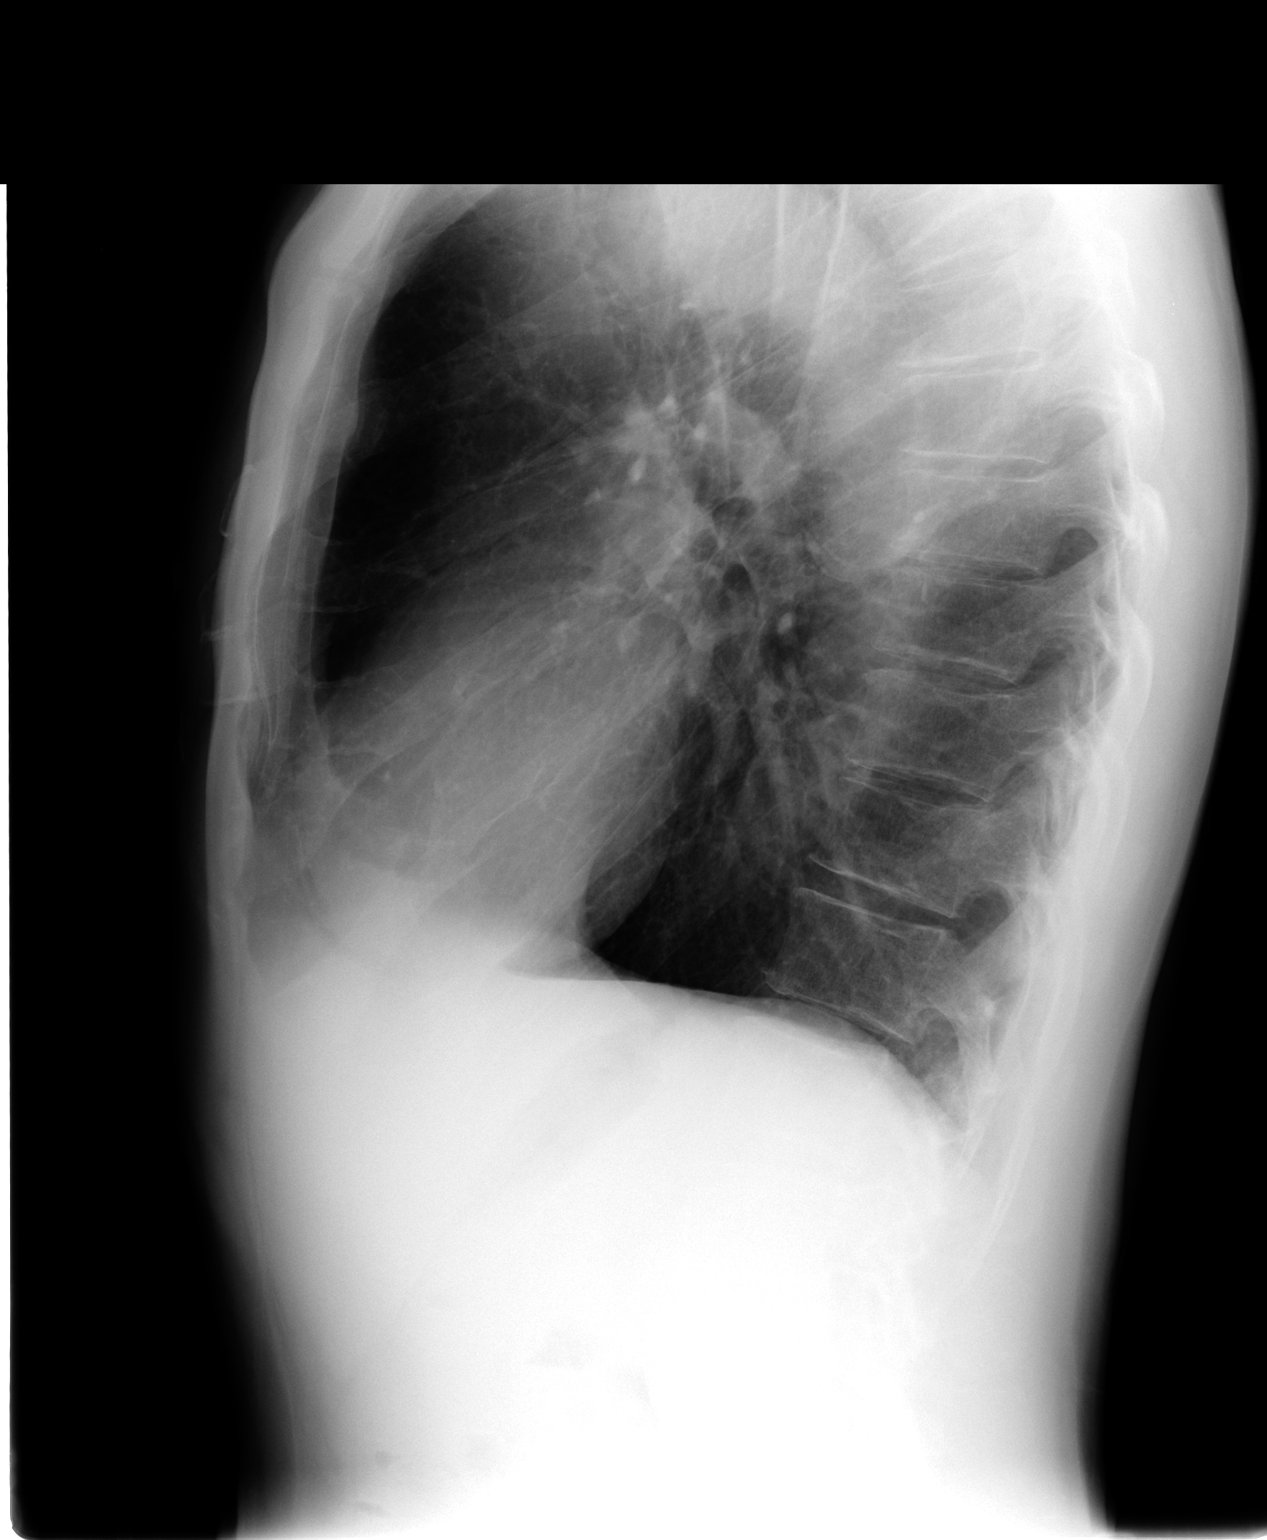

[view not recorded (3 of 3)]
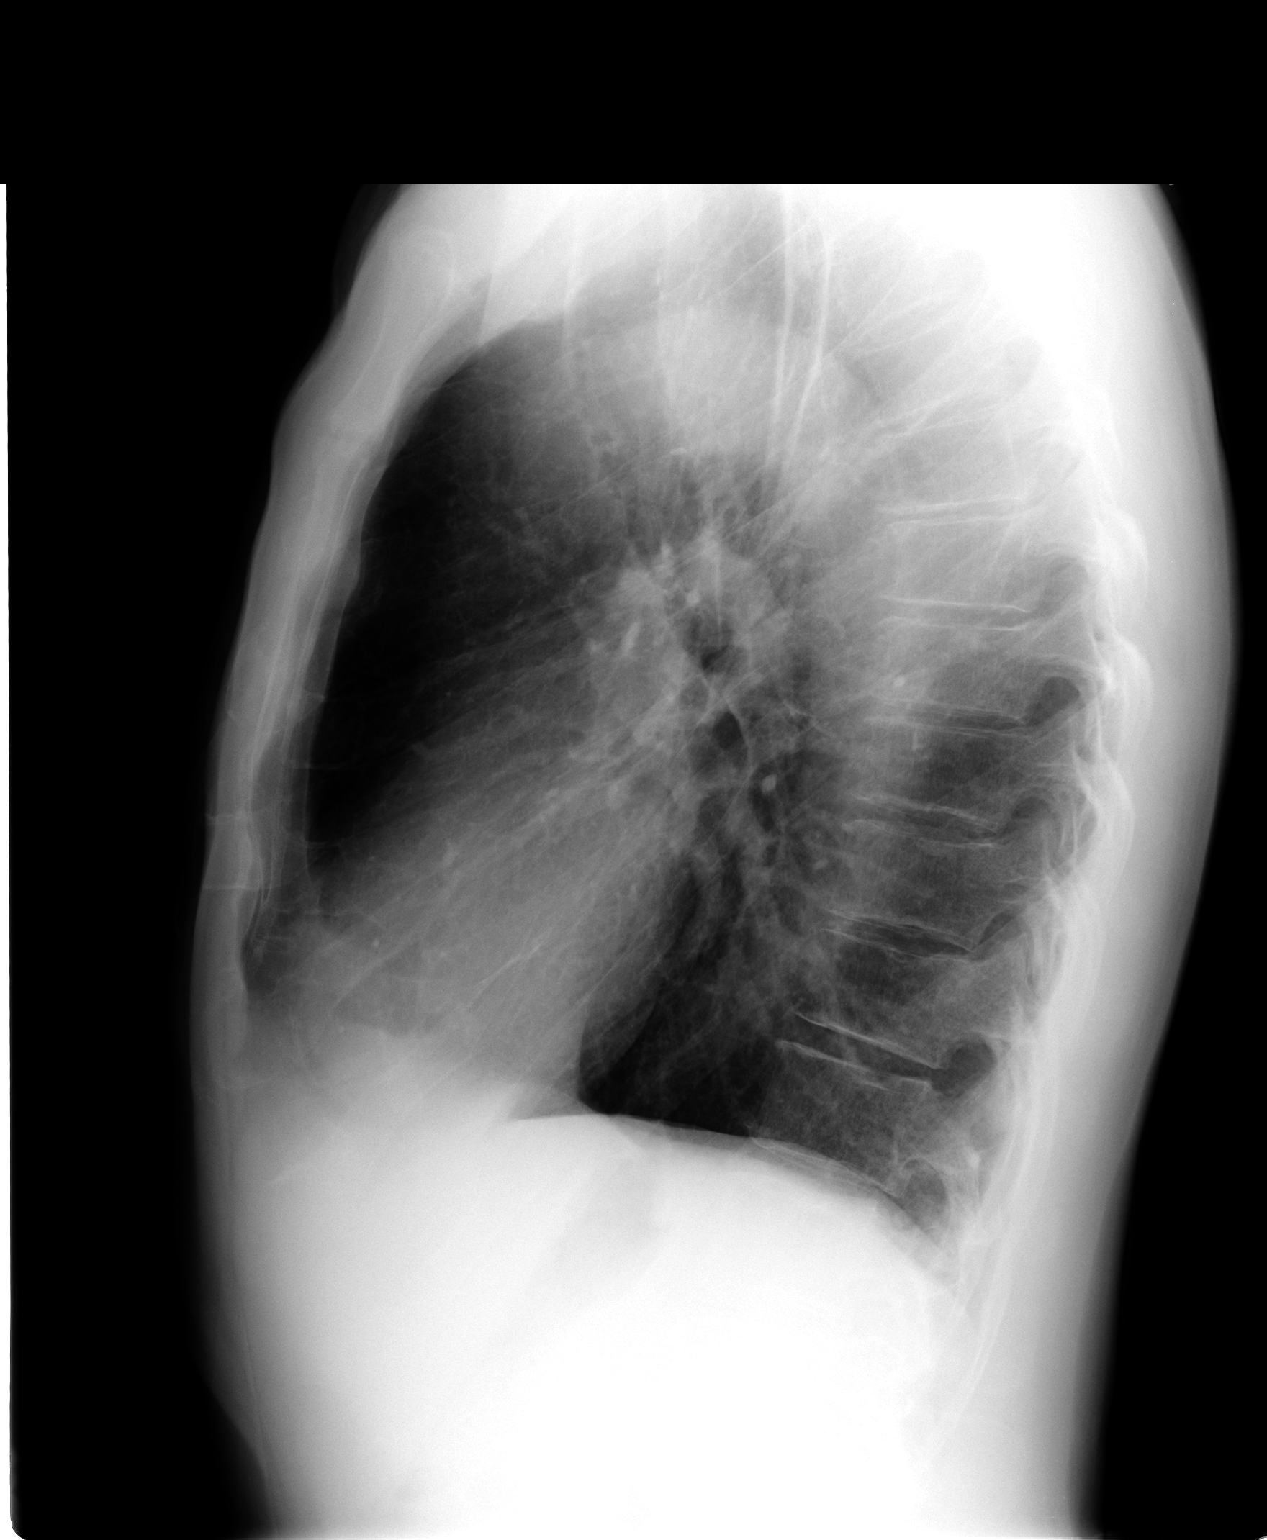

[3 of 3 positions shown; findings below may reference images not displayed]

FINDINGS: Lungs are hyperinflated. Heart size is normal. There are no focal
consolidations or pleural effusions. No pulmonary edema. Visualized
osseous structures have a normal appearance.
IMPRESSION: Hyperinflation. No evidence for acute  abnormality.

## 2014-10-24 ENCOUNTER — Other Ambulatory Visit: Payer: Self-pay | Admitting: Internal Medicine

## 2014-10-24 ENCOUNTER — Ambulatory Visit (INDEPENDENT_AMBULATORY_CARE_PROVIDER_SITE_OTHER): Payer: Medicare HMO | Admitting: Physician Assistant

## 2014-10-24 VITALS — BP 118/78 | HR 56 | Temp 98.1°F | Resp 16 | Ht 66.0 in | Wt 140.0 lb

## 2014-10-24 DIAGNOSIS — R6889 Other general symptoms and signs: Secondary | ICD-10-CM

## 2014-10-24 DIAGNOSIS — L570 Actinic keratosis: Secondary | ICD-10-CM

## 2014-10-24 DIAGNOSIS — Z789 Other specified health status: Secondary | ICD-10-CM

## 2014-10-24 DIAGNOSIS — K219 Gastro-esophageal reflux disease without esophagitis: Secondary | ICD-10-CM

## 2014-10-24 DIAGNOSIS — E785 Hyperlipidemia, unspecified: Secondary | ICD-10-CM

## 2014-10-24 DIAGNOSIS — Z0001 Encounter for general adult medical examination with abnormal findings: Secondary | ICD-10-CM

## 2014-10-24 DIAGNOSIS — E559 Vitamin D deficiency, unspecified: Secondary | ICD-10-CM

## 2014-10-24 DIAGNOSIS — I10 Essential (primary) hypertension: Secondary | ICD-10-CM

## 2014-10-24 LAB — HEPATIC FUNCTION PANEL
ALK PHOS: 59 U/L (ref 39–117)
ALT: 10 U/L (ref 0–53)
AST: 19 U/L (ref 0–37)
Albumin: 4.1 g/dL (ref 3.5–5.2)
BILIRUBIN DIRECT: 0.1 mg/dL (ref 0.0–0.3)
BILIRUBIN TOTAL: 0.8 mg/dL (ref 0.2–1.2)
Indirect Bilirubin: 0.7 mg/dL (ref 0.2–1.2)
Total Protein: 6.7 g/dL (ref 6.0–8.3)

## 2014-10-24 LAB — CBC WITH DIFFERENTIAL/PLATELET
BASOS ABS: 0 10*3/uL (ref 0.0–0.1)
BASOS PCT: 1 % (ref 0–1)
Eosinophils Absolute: 0.1 10*3/uL (ref 0.0–0.7)
Eosinophils Relative: 3 % (ref 0–5)
HEMATOCRIT: 46.4 % (ref 39.0–52.0)
Hemoglobin: 15.2 g/dL (ref 13.0–17.0)
Lymphocytes Relative: 24 % (ref 12–46)
Lymphs Abs: 1.1 10*3/uL (ref 0.7–4.0)
MCH: 29 pg (ref 26.0–34.0)
MCHC: 32.8 g/dL (ref 30.0–36.0)
MCV: 88.5 fL (ref 78.0–100.0)
MONOS PCT: 13 % — AB (ref 3–12)
MPV: 10.6 fL (ref 9.4–12.4)
Monocytes Absolute: 0.6 10*3/uL (ref 0.1–1.0)
NEUTROS PCT: 59 % (ref 43–77)
Neutro Abs: 2.7 10*3/uL (ref 1.7–7.7)
PLATELETS: 193 10*3/uL (ref 150–400)
RBC: 5.24 MIL/uL (ref 4.22–5.81)
RDW: 13.5 % (ref 11.5–15.5)
WBC: 4.6 10*3/uL (ref 4.0–10.5)

## 2014-10-24 LAB — LIPID PANEL
Cholesterol: 200 mg/dL (ref 0–200)
HDL: 68 mg/dL (ref >39)
LDL Cholesterol: 123 mg/dL — ABNORMAL HIGH (ref 0–99)
Total CHOL/HDL Ratio: 2.9 Ratio
Triglycerides: 46 mg/dL (ref <150)
VLDL: 9 mg/dL (ref 0–40)

## 2014-10-24 LAB — BASIC METABOLIC PANEL WITH GFR
BUN: 15 mg/dL (ref 6–23)
CO2: 29 mEq/L (ref 19–32)
Calcium: 9.5 mg/dL (ref 8.4–10.5)
Chloride: 102 mEq/L (ref 96–112)
Creat: 1.04 mg/dL (ref 0.50–1.35)
GFR, EST AFRICAN AMERICAN: 82 mL/min
GFR, EST NON AFRICAN AMERICAN: 71 mL/min
Glucose, Bld: 86 mg/dL (ref 70–99)
Potassium: 4.2 mEq/L (ref 3.5–5.3)
SODIUM: 138 meq/L (ref 135–145)

## 2014-10-24 LAB — MAGNESIUM: Magnesium: 1.9 mg/dL (ref 1.5–2.5)

## 2014-10-25 ENCOUNTER — Encounter: Payer: Self-pay | Admitting: Physician Assistant

## 2014-10-25 LAB — URINALYSIS, ROUTINE W REFLEX MICROSCOPIC
Bilirubin Urine: NEGATIVE
GLUCOSE, UA: NEGATIVE mg/dL
Hgb urine dipstick: NEGATIVE
Ketones, ur: NEGATIVE mg/dL
Leukocytes, UA: NEGATIVE
NITRITE: NEGATIVE
PH: 6 (ref 5.0–8.0)
Protein, ur: NEGATIVE mg/dL
Specific Gravity, Urine: 1.007 (ref 1.005–1.030)
Urobilinogen, UA: 0.2 mg/dL (ref 0.0–1.0)

## 2014-10-25 LAB — TSH: TSH: 2.275 u[IU]/mL (ref 0.350–4.500)

## 2014-10-25 LAB — VITAMIN D 25 HYDROXY (VIT D DEFICIENCY, FRACTURES): VIT D 25 HYDROXY: 40 ng/mL (ref 30–100)

## 2014-10-25 LAB — MICROALBUMIN / CREATININE URINE RATIO
CREATININE, URINE: 28.1 mg/dL
Microalb Creat Ratio: 7.1 mg/g (ref 0.0–30.0)
Microalb, Ur: 0.2 mg/dL (ref <2.0)

## 2014-10-25 LAB — PSA: PSA: 0.72 ng/mL (ref <=4.00)

## 2014-10-25 NOTE — Progress Notes (Signed)
MEDICARE ANNUAL WELLNESS VISIT AND CPE  Assessment:   1. Essential hypertension - continue medications, DASH diet, exercise and monitor at home. Call if greater than 130/80.   2. Gastroesophageal reflux disease without esophagitis Continue PPI/H2 blocker PRN, diet discussed  3. Hyperlipidemia -continue medications, check lipids, decrease fatty foods, increase activity.   4. Vitamin D deficiency Continue supplement  5. AK (actinic keratosis) Patient will schedule follow up, declines treatment at this time. "will watch at home"   Plan:   During the course of the visit the patient was educated and counseled about appropriate screening and preventive services including:    Pneumococcal vaccine   Influenza vaccine  Td vaccine  Screening electrocardiogram  Bone densitometry screening  Colorectal cancer screening  Diabetes screening  Glaucoma screening  Nutrition counseling   Advanced directives: requested  Screening recommendations, referrals: Vaccinations: Please see documentation below and orders this visit.  Nutrition assessed and recommended  Colonoscopy uptodate Recommended yearly ophthalmology/optometry visit for glaucoma screening and checkup Recommended yearly dental visit for hygiene and checkup Advanced directives - requested  Conditions/risks identified: BMI: Discussed weight loss, diet, and increase physical activity.  Increase physical activity: AHA recommends 150 minutes of physical activity a week.  Medications reviewed Urinary Incontinence is not an issue: discussed non pharmacology and pharmacology options.  Fall risk: low- discussed PT, home fall assessment, medications.    Subjective:  Carlos Green is a 73 y.o. male who presents for Medicare Annual Wellness Visit and complete physical.  Date of last medicare wellness visit is unknown.   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 He denies chest pain, shortness of breath, dizziness.  He is not on cholesterol medication and denies myalgias. His cholesterol is at goal. The cholesterol last visit was:   Lab Results  Component Value Date   CHOL 200 10/24/2014   HDL 68 10/24/2014   LDLCALC 123* 10/24/2014   TRIG 46 10/24/2014   CHOLHDL 2.9 10/24/2014   Patient is not on Vitamin D supplement.   Lab Results  Component Value Date   VD25OH 40 10/24/2014     Lab Results  Component Value Date   PSA 0.72 10/24/2014   PSA 0.57 10/24/2013     Names of Other Physician/Practitioners you currently use: 1. [facility] here for primary care 2. [facility], eye doctor, last visit 6 months 3. none, dentist Patient Care Team: [name], MD as PCP - General (Internal Medicine)   Medication Review: No current outpatient prescriptions on file prior to visit.   No current facility-administered medications on file prior to visit.    Current Problems (verified) Patient Active Problem List   Diagnosis Date Noted  . Hypertension   . Hyperlipidemia   . GERD (gastroesophageal reflux disease)   . Vitamin D deficiency     Screening Tests Health Maintenance  Topic Date Due  . ZOSTAVAX  09/21/2001  . INFLUENZA VACCINE  06/21/2014  . COLONOSCOPY  11/21/2017  . TETANUS/TDAP  11/21/2020  . PNEUMOCOCCAL POLYSACCHARIDE VACCINE AGE 37 AND OVER  Completed    Immunization History  Administered Date(s) Administered  . Pneumococcal-Unspecified 11/21/2005  . Td 11/21/2010    Preventative care: Tetanus: 09/2011 Pneumovax:01/12/2006 Prevnar declines Flu vaccine: declines Zostavax: declines DEXA: declines Colonoscopy: 2009 due 2019 EGD: 2009  History reviewed: allergies, current medications, past family history, past medical history,  past social history, past surgical history and problem list    Medication List    Notice  As  of 10/24/2014 11:59 PM   You have not been prescribed any medications.      Past Surgical History  Procedure Laterality Date  . Appendectomy    . Hernia repair Right 2010    inguinal  . Tonsillectomy and adenoidectomy     Family History  Problem Relation Age of Onset  . Diabetes Mother   . Stroke Mother   . Hypertension Father   . Cancer Sister     breast    Risk Factors: Tobacco History  Substance Use Topics  . Smoking status: Former Smoker    Quit date: 10/22/1991  . Smokeless tobacco: Never Used  . Alcohol Use: No   He does not smoke.  Patient is a former smoker. Are there smokers in your home (other than you)?  No  Alcohol Current alcohol use: none  Caffeine Current caffeine use: coffee 3 /day  Exercise Current exercise: no regular exercise  Nutrition/Diet Current diet: in general, a "healthy" diet    Cardiac risk factors: advanced age (older than 1855 for men, 4365 for women), dyslipidemia, hypertension and male gender.  Depression Screen (Note: if answer to either of the following is "Yes", a more complete depression screening is indicated)   Q1: Over the past two weeks, have you felt down, depressed or hopeless? No  Q2: Over the past two weeks, have you felt little interest or pleasure in doing things? No  Have you lost interest or pleasure in daily life? No  Do you often feel hopeless? No  Do you cry easily over simple problems? No  Activities of Daily Living In your present state of health, do you have any difficulty performing the following activities?:  Driving? No Managing money?  No Feeding yourself? No Getting from bed to chair? No Climbing a flight of stairs? No Preparing food and eating?: No Bathing or showering? No Getting dressed: No Getting to the toilet? No Using the toilet:No Moving around from place to place: No In the past year have you fallen or had a near fall?:No   Are you sexually active?  No  Do you have more than one  partner?  No  Vision Difficulties: No  Hearing Difficulties: No Do you often ask people to speak up or repeat themselves? No Do you experience ringing or noises in your ears? No Do you have difficulty understanding soft or whispered voices? No  Cognition  Do you feel that you have a problem with memory?No  Do you often misplace items? No  Do you feel safe at home?  Yes  Advanced directives Does patient have a Health Care Power of Attorney? No Does patient have a Living Will? No   Objective:     Blood pressure 118/78, pulse 56, temperature 98.1 F (36.7 C), temperature source Temporal, resp. rate 16, height 5\' 6"  (1.676 m), weight 140 lb (63.504 kg). Body mass index is 22.61 kg/(m^2).  General appearance: alert, no distress, WD/WN, male Cognitive Testing  Alert? Yes  Normal Appearance?Yes  Oriented to person? Yes  Place? Yes   Time? Yes  Recall of three objects?  Yes  Can perform simple calculations? Yes  Displays appropriate judgment?Yes  Can read the correct time from a watch face?Yes  HEENT: normocephalic, sclerae anicteric, TMs pearly, nares patent, no discharge or erythema, pharynx normal Oral cavity: MMM, no lesions Neck: supple, no lymphadenopathy, no thyromegaly,  no masses Heart: RRR, normal S1, S2, no murmurs Lungs: CTA bilaterally, no wheezes, rhonchi, or rales Abdomen: +bs, soft, non tender, non distended, no masses, no hepatomegaly, no splenomegaly Musculoskeletal: nontender, no swelling, no obvious deformity Extremities: no edema, no cyanosis, no clubbing Pulses: 2+ symmetric, upper and lower extremities, normal cap refill Neurological: alert, oriented x 3, CN2-12 intact, strength normal upper extremities and lower extremities, sensation normal throughout, DTRs 2+ throughout, no cerebellar signs, gait normal Skin: several erythematous, scaly plaques on arms Psychiatric: normal affect, behavior normal, pleasant   Medicare Attestation I have personally  reviewed: The patient's medical and social history Their use of alcohol, tobacco or illicit drugs Their current medications and supplements The patient's functional ability including ADLs,fall risks, home safety risks, cognitive, and hearing and visual impairment Diet and physical activities Evidence for depression or mood disorders  The patient's weight, height, BMI, and visual acuity have been recorded in the chart.  I have made referrals, counseling, and provided education to the patient based on review of the above and I have provided the patient with a written personalized care plan for preventive services.     Carlos Green, Carlos Criado, PA-C   10/27/2014

## 2014-11-01 NOTE — Addendum Note (Signed)
Addended by: Yola Paradiso A on: 11/01/2014 10:25 AM   Modules accepted: Orders

## 2014-11-13 NOTE — Addendum Note (Signed)
Addended by: Trishia Cuthrell A on: 11/13/2014 08:49 AM   Modules accepted: Orders

## 2015-10-30 ENCOUNTER — Ambulatory Visit (INDEPENDENT_AMBULATORY_CARE_PROVIDER_SITE_OTHER): Payer: Medicare HMO | Admitting: Physician Assistant

## 2015-10-30 ENCOUNTER — Encounter: Payer: Self-pay | Admitting: Physician Assistant

## 2015-10-30 VITALS — BP 124/82 | HR 47 | Temp 98.1°F | Resp 14 | Ht 66.0 in | Wt 138.0 lb

## 2015-10-30 DIAGNOSIS — Z0001 Encounter for general adult medical examination with abnormal findings: Secondary | ICD-10-CM

## 2015-10-30 DIAGNOSIS — K219 Gastro-esophageal reflux disease without esophagitis: Secondary | ICD-10-CM | POA: Diagnosis not present

## 2015-10-30 DIAGNOSIS — R6889 Other general symptoms and signs: Secondary | ICD-10-CM

## 2015-10-30 DIAGNOSIS — I10 Essential (primary) hypertension: Secondary | ICD-10-CM | POA: Diagnosis not present

## 2015-10-30 DIAGNOSIS — E785 Hyperlipidemia, unspecified: Secondary | ICD-10-CM

## 2015-10-30 DIAGNOSIS — Z0000 Encounter for general adult medical examination without abnormal findings: Secondary | ICD-10-CM

## 2015-10-30 DIAGNOSIS — Z79899 Other long term (current) drug therapy: Secondary | ICD-10-CM

## 2015-10-30 DIAGNOSIS — R001 Bradycardia, unspecified: Secondary | ICD-10-CM | POA: Diagnosis not present

## 2015-10-30 DIAGNOSIS — E559 Vitamin D deficiency, unspecified: Secondary | ICD-10-CM

## 2015-10-30 DIAGNOSIS — N400 Enlarged prostate without lower urinary tract symptoms: Secondary | ICD-10-CM | POA: Diagnosis not present

## 2015-10-30 LAB — CBC WITH DIFFERENTIAL/PLATELET
BASOS ABS: 0 10*3/uL (ref 0.0–0.1)
Basophils Relative: 1 % (ref 0–1)
Eosinophils Absolute: 0.1 10*3/uL (ref 0.0–0.7)
Eosinophils Relative: 3 % (ref 0–5)
HEMATOCRIT: 45.7 % (ref 39.0–52.0)
Hemoglobin: 14.9 g/dL (ref 13.0–17.0)
LYMPHS PCT: 30 % (ref 12–46)
Lymphs Abs: 1.3 10*3/uL (ref 0.7–4.0)
MCH: 28.9 pg (ref 26.0–34.0)
MCHC: 32.6 g/dL (ref 30.0–36.0)
MCV: 88.6 fL (ref 78.0–100.0)
MPV: 11.2 fL (ref 8.6–12.4)
Monocytes Absolute: 0.4 10*3/uL (ref 0.1–1.0)
Monocytes Relative: 10 % (ref 3–12)
Neutro Abs: 2.4 10*3/uL (ref 1.7–7.7)
Neutrophils Relative %: 56 % (ref 43–77)
Platelets: 161 10*3/uL (ref 150–400)
RBC: 5.16 MIL/uL (ref 4.22–5.81)
RDW: 13.5 % (ref 11.5–15.5)
WBC: 4.2 10*3/uL (ref 4.0–10.5)

## 2015-10-30 LAB — LIPID PANEL
CHOL/HDL RATIO: 2.7 ratio (ref <=5.0)
Cholesterol: 194 mg/dL (ref 125–200)
HDL: 72 mg/dL (ref >=40)
LDL Cholesterol: 112 mg/dL (ref <130)
TRIGLYCERIDES: 49 mg/dL (ref <150)
VLDL: 10 mg/dL (ref <30)

## 2015-10-30 LAB — BASIC METABOLIC PANEL WITH GFR
BUN: 15 mg/dL (ref 7–25)
CO2: 30 mmol/L (ref 20–31)
Calcium: 9.3 mg/dL (ref 8.6–10.3)
Chloride: 103 mmol/L (ref 98–110)
Creat: 1.04 mg/dL (ref 0.70–1.18)
GFR, EST AFRICAN AMERICAN: 81 mL/min (ref >=60)
GFR, EST NON AFRICAN AMERICAN: 70 mL/min (ref >=60)
Glucose, Bld: 85 mg/dL (ref 65–99)
POTASSIUM: 4.4 mmol/L (ref 3.5–5.3)
SODIUM: 140 mmol/L (ref 135–146)

## 2015-10-30 LAB — HEPATIC FUNCTION PANEL
ALK PHOS: 61 U/L (ref 40–115)
ALT: 10 U/L (ref 9–46)
AST: 19 U/L (ref 10–35)
Albumin: 4 g/dL (ref 3.6–5.1)
BILIRUBIN DIRECT: 0.1 mg/dL (ref <=0.2)
BILIRUBIN INDIRECT: 0.7 mg/dL (ref 0.2–1.2)
BILIRUBIN TOTAL: 0.8 mg/dL (ref 0.2–1.2)
Total Protein: 6.4 g/dL (ref 6.1–8.1)

## 2015-10-30 LAB — TSH: TSH: 2.136 u[IU]/mL (ref 0.350–4.500)

## 2015-10-30 LAB — MAGNESIUM: MAGNESIUM: 1.9 mg/dL (ref 1.5–2.5)

## 2015-10-30 NOTE — Progress Notes (Signed)
MEDICARE ANNUAL WELLNESS VISIT AND CPE  Assessment:   1. Essential hypertension - continue medications, DASH diet, exercise and monitor at home. Call if greater than 130/80.   2. Gastroesophageal reflux disease without esophagitis Continue PPI/H2 blocker PRN, diet discussed  3. Hyperlipidemia -continue medications, check lipids, decrease fatty foods, increase activity.   4. Vitamin D deficiency Continue supplement  5. AK (actinic keratosis) Patient will schedule follow up, declines treatment at this time. "will watch at home"  6. Medicare visit Declines vaccines   Plan:   During the course of the visit the patient was educated and counseled about appropriate screening and preventive services including:    Pneumococcal vaccine   Influenza vaccine  Td vaccine  Screening electrocardiogram  Bone densitometry screening  Colorectal cancer screening  Diabetes screening  Glaucoma screening  Nutrition counseling   Advanced directives: requested  Conditions/risks identified: BMI: Discussed weight loss, diet, and increase physical activity.  Increase physical activity: AHA recommends 150 minutes of physical activity a week.  Medications reviewed Urinary Incontinence is not an issue: discussed non pharmacology and pharmacology options.  Fall risk: low- discussed PT, home fall assessment, medications.    Subjective:  Carlos Green is a 74 y.o. male who presents for Medicare Annual Wellness Visit and complete physical.  Date of last medicare wellness visit is unknown.   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  He is not on cholesterol medication and denies myalgias. His cholesterol is at goal. The cholesterol last visit was:   Lab Results  Component Value Date   CHOL 200 10/24/2014   HDL 68 10/24/2014   LDLCALC 123* 10/24/2014   TRIG 46 10/24/2014   CHOLHDL 2.9 10/24/2014   Patient is not on Vitamin D supplement.   Lab Results  Component Value Date   VD25OH 40 10/24/2014     Lab Results  Component Value Date   PSA 0.72 10/24/2014   PSA 0.57 10/24/2013     Names of Other Physician/Practitioners you currently use: 1. [facility] here for primary care 2. [facility], eye doctor, 1 year ago, wears glasses 3. none, dentist Patient Care Team: Lucky CowboyWilliam McKeown, MD as PCP - General (Internal Medicine)  Medication Review: No current outpatient prescriptions on file prior to visit.   No current facility-administered medications on file prior to visit.    Current Problems (verified) Patient Active Problem List   Diagnosis Date Noted  . Hypertension   . Hyperlipidemia   . GERD (gastroesophageal reflux disease)   . Vitamin D deficiency     Screening Tests Health Maintenance  Topic Date Due  . PNA vac Low Risk Adult (1 of 2 - PCV13) 11/21/2006  . INFLUENZA VACCINE  10/27/2016 (Originally 06/22/2015)  . ZOSTAVAX  10/27/2016 (Originally 09/21/2001)  . COLONOSCOPY  11/21/2017  . TETANUS/TDAP  11/21/2020    Immunization History  Administered Date(s) Administered  . Pneumococcal-Unspecified 11/21/2005  . Td 11/21/2010    Preventative care: Tetanus: 09/2011 Pneumovax:01/12/2006 Prevnar declines Flu vaccine: declines Zostavax: declines DEXA: declines Colonoscopy: 2009 due 2019 EGD: 2009  Allergies No Known Allergies Surgical history Past Surgical History  Procedure Laterality Date  . Appendectomy    . Hernia repair Right 2010    inguinal  . Tonsillectomy and adenoidectomy  Family history Family History  Problem Relation Age of Onset  . Diabetes Mother   . Stroke Mother   . Hypertension Father   . Cancer Sister     breast      Medication List    Notice  As of 10/30/2015 10:01 AM   You have not been  prescribed any medications.     Risk Factors: Tobacco Social History  Substance Use Topics  . Smoking status: Former Smoker    Quit date: 10/22/1991  . Smokeless tobacco: Never Used  . Alcohol Use: No   He does not smoke.  Patient is a former smoker. Are there smokers in your home (other than you)?  No  Alcohol Current alcohol use: none  Caffeine Current caffeine use: coffee 3 /day  Exercise Current exercise: no regular exercise and walking  Nutrition/Diet Current diet: in general, a "healthy" diet    Cardiac risk factors: advanced age (older than 36 for men, 55 for women), dyslipidemia, hypertension and male gender.  Depression Screen (Note: if answer to either of the following is "Yes", a more complete depression screening is indicated)   Q1: Over the past two weeks, have you felt down, depressed or hopeless? No  Q2: Over the past two weeks, have you felt little interest or pleasure in doing things? No  Have you lost interest or pleasure in daily life? No  Do you often feel hopeless? No  Do you cry easily over simple problems? No  Activities of Daily Living In your present state of health, do you have any difficulty performing the following activities?:  Driving? No Managing money?  No Feeding yourself? No Getting from bed to chair? No Climbing a flight of stairs? No Preparing food and eating?: No Bathing or showering? No Getting dressed: No Getting to the toilet? No Using the toilet:No Moving around from place to place: No In the past year have you fallen or had a near fall?:No   Are you sexually active?  No  Do you have more than one partner?  No  Vision Difficulties: Yes glasses  Hearing Difficulties: No Do you often ask people to speak up or repeat themselves? No Do you experience ringing or noises in your ears? No Do you have difficulty understanding soft or whispered voices? No  Cognition  Do you feel that you have a problem with memory?No  Do  you often misplace items? No  Do you feel safe at home?  Yes  Advanced directives Does patient have a Health Care Power of Attorney? No Does patient have a Living Will? No   Objective:     Blood pressure 124/82, pulse 47, temperature 98.1 F (36.7 C), temperature source Temporal, resp. rate 14, height  (1.676 m), weight 138 lb (62.596 kg), SpO2 97 %. Body mass index is 22.28 kg/(m^2).  General appearance: alert, no distress, WD/WN, male Cognitive Testing  Alert? Yes  Normal Appearance?Yes  Oriented to person? Yes  Place? Yes   Time? Yes  Recall of three objects?  Yes  Can perform simple calculations? Yes  Displays appropriate judgment?Yes  Can read the correct time from a watch face?Yes  HEENT: normocephalic, sclerae anicteric, TMs pearly, nares patent, no discharge or erythema, pharynx normal Oral cavity: MMM, no lesions Neck: supple, no lymphadenopathy, no thyromegaly, no masses Heart: RRR, normal S1, S2, no murmurs Lungs: CTA bilaterally, no wheezes, rhonchi, or rales Abdomen: +bs, soft, non tender, non distended, no masses, no hepatomegaly, no splenomegaly Musculoskeletal: nontender, no  swelling, no obvious deformity Extremities: no edema, no cyanosis, no clubbing Pulses: 2+ symmetric, upper and lower extremities, normal cap refill Neurological: alert, oriented x 3, CN2-12 intact, strength normal upper extremities and lower extremities, sensation normal throughout, DTRs 2+ throughout, no cerebellar signs, gait normal Skin: several erythematous, scaly plaques on arms Psychiatric: normal affect, behavior normal, pleasant   Medicare Attestation I have personally reviewed: The patient's medical and social history Their use of alcohol, tobacco or illicit drugs Their current medications and supplements The patient's functional ability including ADLs,fall risks, home safety risks, cognitive, and hearing and visual impairment Diet and physical activities Evidence for  depression or mood disorders  The patient's weight, height, BMI, and visual acuity have been recorded in the chart.  I have made referrals, counseling, and provided education to the patient based on review of the above and I have provided the patient with a written personalized care plan for preventive services.     Quentin Mulling, PA-C   10/30/2015

## 2015-10-30 NOTE — Patient Instructions (Signed)
Preventive Care for Adults A healthy lifestyle and preventive care can promote health and wellness. Preventive health guidelines for men include the following key practices:  A routine yearly physical is a good way to check with your health care provider about your health and preventative screening. It is a chance to share any concerns and updates on your health and to receive a thorough exam.  Visit your dentist for a routine exam and preventative care every 6 months. Brush your teeth twice a day and floss once a day. Good oral hygiene prevents tooth decay and gum disease.  The frequency of eye exams is based on your age, health, family medical history, use of contact lenses, and other factors. Follow your health care provider's recommendations for frequency of eye exams.  Eat a healthy diet. Foods such as vegetables, fruits, whole grains, low-fat dairy products, and lean protein foods contain the nutrients you need without too many calories. Decrease your intake of foods high in solid fats, added sugars, and salt. Eat the right amount of calories for you.Get information about a proper diet from your health care provider, if necessary.  Regular physical exercise is one of the most important things you can do for your health. Most adults should get at least 150 minutes of moderate-intensity exercise (any activity that increases your heart rate and causes you to sweat) each week. In addition, most adults need muscle-strengthening exercises on 2 or more days a week.  Maintain a healthy weight. The body mass index (BMI) is a screening tool to identify possible weight problems. It provides an estimate of body fat based on height and weight. Your health care provider can find your BMI and can help you achieve or maintain a healthy weight.For adults 20 years and older:  A BMI below 18.5 is considered underweight.  A BMI of 18.5 to 24.9 is normal.  A BMI of 25 to 29.9 is considered overweight.  A BMI  of 30 and above is considered obese.  Maintain normal blood lipids and cholesterol levels by exercising and minimizing your intake of saturated fat. Eat a balanced diet with plenty of fruit and vegetables. Blood tests for lipids and cholesterol should begin at age 20 and be repeated every 5 years. If your lipid or cholesterol levels are high, you are over 50, or you are at high risk for heart disease, you may need your cholesterol levels checked more frequently.Ongoing high lipid and cholesterol levels should be treated with medicines if diet and exercise are not working.  If you smoke, find out from your health care provider how to quit. If you do not use tobacco, do not start.  Lung cancer screening is recommended for adults aged 55-80 years who are at high risk for developing lung cancer because of a history of smoking. A yearly low-dose CT scan of the lungs is recommended for people who have at least a 30-pack-year history of smoking and are a current smoker or have quit within the past 15 years. A pack year of smoking is smoking an average of 1 pack of cigarettes a day for 1 year (for example: 1 pack a day for 30 years or 2 packs a day for 15 years). Yearly screening should continue until the smoker has stopped smoking for at least 15 years. Yearly screening should be stopped for people who develop a health problem that would prevent them from having lung cancer treatment.  If you choose to drink alcohol, do not have more than   2 drinks per day. One drink is considered to be 12 ounces (355 mL) of beer, 5 ounces (148 mL) of wine, or 1.5 ounces (44 mL) of liquor.  Avoid use of street drugs. Do not share needles with anyone. Ask for help if you need support or instructions about stopping the use of drugs.  High blood pressure causes heart disease and increases the risk of stroke. Your blood pressure should be checked at least every 1-2 years. Ongoing high blood pressure should be treated with  medicines, if weight loss and exercise are not effective.  If you are 45-79 years old, ask your health care provider if you should take aspirin to prevent heart disease.  Diabetes screening involves taking a blood sample to check your fasting blood sugar level. Testing should be considered at a younger age or be carried out more frequently if you are overweight and have at least 1 risk factor for diabetes.  Colorectal cancer can be detected and often prevented. Most routine colorectal cancer screening begins at the age of 50 and continues through age 75. However, your health care provider may recommend screening at an earlier age if you have risk factors for colon cancer. On a yearly basis, your health care provider may provide home test kits to check for hidden blood in the stool. Use of a small camera at the end of a tube to directly examine the colon (sigmoidoscopy or colonoscopy) can detect the earliest forms of colorectal cancer. Talk to your health care provider about this at age 50, when routine screening begins. Direct exam of the colon should be repeated every 5-10 years through age 75, unless early forms of precancerous polyps or small growths are found.  Hepatitis C blood testing is recommended for all people born from 1945 through 1965 and any individual with known risks for hepatitis C.  New guidelines recommend a once time screening for HIV.   Screening for abdominal aortic aneurysm (AAA)  by ultrasound is recommended for people who have history of high blood pressure or who are current or former smokers.  Healthy men should  receive prostate-specific antigen (PSA) blood tests as part of routine cancer screening. Talk with your health care provider about prostate cancer screening.  Testicular cancer screening is  recommended for adult males. Screening includes self-exam, a health care provider exam, and other screening tests. Consult with your health care provider about any symptoms you  have or any concerns you have about testicular cancer.  Use sunscreen. Apply sunscreen liberally and repeatedly throughout the day. You should seek shade when your shadow is shorter than you. Protect yourself by wearing long sleeves, pants, a wide-brimmed hat, and sunglasses year round, whenever you are outdoors.  Once a month, do a whole-body skin exam, using a mirror to look at the skin on your back. Tell your health care provider about new moles, moles that have irregular borders, moles that are larger than a pencil eraser, or moles that have changed in shape or color.  Stay current with required vaccines (immunizations).  Influenza vaccine. All adults should be immunized every year.  Tetanus, diphtheria, and acellular pertussis (Td, Tdap) vaccine. An adult who has not previously received Tdap or who does not know his vaccine status should receive 1 dose of Tdap. This initial dose should be followed by tetanus and diphtheria toxoids (Td) booster doses every 10 years. Adults with an unknown or incomplete history of completing a 3-dose immunization series with Td-containing vaccines should begin or   complete a primary immunization series including a Tdap dose. Adults should receive a Td booster every 10 years.  Zoster vaccine. One dose is recommended for adults aged 60 years or older unless certain conditions are present.    PREVNAR - Pneumococcal 13-valent conjugate (PCV13) vaccine. When indicated, a person who is uncertain of his immunization history and has no record of immunization should receive the PCV13 vaccine. An adult aged 19 years or older who has certain medical conditions and has not been previously immunized should receive 1 dose of PCV13 vaccine. This PCV13 should be followed with a dose of pneumococcal polysaccharide (PPSV23) vaccine. The PPSV23 vaccine dose should be obtained at least 8 weeks after the dose of PCV13 vaccine. An adult aged 19 years or older who has certain medical  conditions and previously received 1 or more doses of PPSV23 vaccine should receive 1 dose of PCV13. The PCV13 vaccine dose should be obtained 1 or more years after the last PPSV23 vaccine dose.    PNEUMOVAX - Pneumococcal polysaccharide (PPSV23) vaccine. When PCV13 is also indicated, PCV13 should be obtained first. All adults aged 65 years and older should be immunized. An adult younger than age 65 years who has certain medical conditions should be immunized. Any person who resides in a nursing home or long-term care facility should be immunized. An adult smoker should be immunized. People with an immunocompromised condition and certain other conditions should receive both PCV13 and PPSV23 vaccines. People with human immunodeficiency virus (HIV) infection should be immunized as soon as possible after diagnosis. Immunization during chemotherapy or radiation therapy should be avoided. Routine use of PPSV23 vaccine is not recommended for American Indians, [place] Natives, or people younger than 65 years unless there are medical conditions that require PPSV23 vaccine. When indicated, people who have unknown immunization and have no record of immunization should receive PPSV23 vaccine. One-time revaccination 5 years after the first dose of PPSV23 is recommended for people aged 19-64 years who have chronic kidney failure, nephrotic syndrome, asplenia, or immunocompromised conditions. People who received 1-2 doses of PPSV23 before age 65 years should receive another dose of PPSV23 vaccine at age 65 years or later if at least 5 years have passed since the previous dose. Doses of PPSV23 are not needed for people immunized with PPSV23 at or after age 65 years.    Hepatitis A vaccine. Adults who wish to be protected from this disease, have certain high-risk conditions, work with hepatitis A-infected animals, work in hepatitis A research labs, or travel to or work in countries with a high rate of hepatitis A should be  immunized. Adults who were previously unvaccinated and who anticipate close contact with an international adoptee during the first 60 days after arrival in the [place] from a country with a high rate of hepatitis A should be immunized.    Hepatitis B vaccine. Adults should be immunized if they wish to be protected from this disease, have certain high-risk conditions, may be exposed to blood or other infectious body fluids, are household contacts or sex partners of hepatitis B positive people, are clients or workers in certain care facilities, or travel to or work in countries with a high rate of hepatitis B.   Preventive Service / Frequency   Ages 65 and over  Blood pressure check.  Lipid and cholesterol check.  Lung cancer screening. / Every year if you are aged 55-80 years and have a 30-pack-year history of smoking and currently smoke or have quit   within the past 15 years. Yearly screening is stopped once you have quit smoking for at least 15 years or develop a health problem that would prevent you from having lung cancer treatment.  Fecal occult blood test (FOBT) of stool. You may not have to do this test if you get a colonoscopy every 10 years.  Flexible sigmoidoscopy** or colonoscopy.** / Every 5 years for a flexible sigmoidoscopy or every 10 years for a colonoscopy beginning at age 50 and continuing until age 75.  Hepatitis C blood test.** / For all people born from 1945 through 1965 and any individual with known risks for hepatitis C.  Abdominal aortic aneurysm (AAA) screening./ Screening current or former smokers or have Hypertension.  Skin self-exam. / Monthly.  Influenza vaccine. / Every year.  Tetanus, diphtheria, and acellular pertussis (Tdap/Td) vaccine.** / 1 dose of Td every 10 years.   Zoster vaccine.** / 1 dose for adults aged 60 years or older.         Pneumococcal 13-valent conjugate (PCV13) vaccine.    Pneumococcal polysaccharide (PPSV23) vaccine.      Hepatitis A vaccine.** / Consult your health care provider.  Hepatitis B vaccine.** / Consult your health care provider. Screening for abdominal aortic aneurysm (AAA)  by ultrasound is recommended for people who have history of high blood pressure or who are current or former smokers.   

## 2015-10-31 LAB — URINALYSIS, ROUTINE W REFLEX MICROSCOPIC
BILIRUBIN URINE: NEGATIVE
Glucose, UA: NEGATIVE
Hgb urine dipstick: NEGATIVE
Ketones, ur: NEGATIVE
LEUKOCYTES UA: NEGATIVE
NITRITE: NEGATIVE
PROTEIN: NEGATIVE
Specific Gravity, Urine: 1.004 (ref 1.001–1.035)
pH: 6.5 (ref 5.0–8.0)

## 2015-10-31 LAB — PSA: PSA: 0.56 ng/mL (ref <=4.00)

## 2015-10-31 LAB — MICROALBUMIN / CREATININE URINE RATIO
Creatinine, Urine: 23 mg/dL (ref 20–370)
Microalb, Ur: <0.2 mg/dL

## 2016-05-18 ENCOUNTER — Telehealth: Payer: Self-pay | Admitting: Physician Assistant

## 2016-05-18 ENCOUNTER — Other Ambulatory Visit: Payer: Self-pay | Admitting: Physician Assistant

## 2016-05-18 MED ORDER — PREDNISONE 20 MG PO TABS: ORAL_TABLET | ORAL | Status: DC

## 2016-05-18 NOTE — Telephone Encounter (Signed)
Patient out of town for work, got stong 730 last night on right hand, has swelling, no trouble swallowing, breathing, no fever, chills.  Taky benadryl, [place] [facility] [place]

## 2016-10-31 ENCOUNTER — Ambulatory Visit (INDEPENDENT_AMBULATORY_CARE_PROVIDER_SITE_OTHER): Payer: Medicare HMO | Admitting: Physician Assistant

## 2016-10-31 ENCOUNTER — Encounter: Payer: Self-pay | Admitting: Physician Assistant

## 2016-10-31 VITALS — BP 126/74 | HR 80 | Temp 97.9°F | Resp 14 | Ht 66.0 in | Wt 144.8 lb

## 2016-10-31 DIAGNOSIS — R001 Bradycardia, unspecified: Secondary | ICD-10-CM | POA: Diagnosis not present

## 2016-10-31 DIAGNOSIS — I10 Essential (primary) hypertension: Secondary | ICD-10-CM | POA: Diagnosis not present

## 2016-10-31 DIAGNOSIS — E559 Vitamin D deficiency, unspecified: Secondary | ICD-10-CM | POA: Diagnosis not present

## 2016-10-31 DIAGNOSIS — N400 Enlarged prostate without lower urinary tract symptoms: Secondary | ICD-10-CM | POA: Diagnosis not present

## 2016-10-31 DIAGNOSIS — Z0001 Encounter for general adult medical examination with abnormal findings: Secondary | ICD-10-CM | POA: Diagnosis not present

## 2016-10-31 DIAGNOSIS — Z79899 Other long term (current) drug therapy: Secondary | ICD-10-CM

## 2016-10-31 DIAGNOSIS — R6889 Other general symptoms and signs: Secondary | ICD-10-CM | POA: Diagnosis not present

## 2016-10-31 DIAGNOSIS — E785 Hyperlipidemia, unspecified: Secondary | ICD-10-CM | POA: Diagnosis not present

## 2016-10-31 DIAGNOSIS — Z0000 Encounter for general adult medical examination without abnormal findings: Secondary | ICD-10-CM

## 2016-10-31 DIAGNOSIS — K219 Gastro-esophageal reflux disease without esophagitis: Secondary | ICD-10-CM

## 2016-10-31 LAB — CBC WITH DIFFERENTIAL/PLATELET
BASOS ABS: 49 {cells}/uL (ref 0–200)
BASOS PCT: 1 %
EOS ABS: 147 {cells}/uL (ref 15–500)
Eosinophils Relative: 3 %
HEMATOCRIT: 46.8 % (ref 38.5–50.0)
HEMOGLOBIN: 15.1 g/dL (ref 13.2–17.1)
Lymphocytes Relative: 23 %
Lymphs Abs: 1127 cells/uL (ref 850–3900)
MCH: 29.5 pg (ref 27.0–33.0)
MCHC: 32.3 g/dL (ref 32.0–36.0)
MCV: 91.4 fL (ref 80.0–100.0)
MONO ABS: 539 {cells}/uL (ref 200–950)
MONOS PCT: 11 %
MPV: 11 fL (ref 7.5–12.5)
NEUTROS ABS: 3038 {cells}/uL (ref 1500–7800)
Neutrophils Relative %: 62 %
PLATELETS: 178 10*3/uL (ref 140–400)
RBC: 5.12 MIL/uL (ref 4.20–5.80)
RDW: 13 % (ref 11.0–15.0)
WBC: 4.9 10*3/uL (ref 3.8–10.8)

## 2016-10-31 LAB — PSA: PSA: 0.8 ng/mL (ref <=4.0)

## 2016-10-31 LAB — TSH: TSH: 3.33 m[IU]/L (ref 0.40–4.50)

## 2016-10-31 NOTE — Patient Instructions (Signed)
Preventive Care for Adults A healthy lifestyle and preventive care can promote health and wellness. Preventive health guidelines for men include the following key practices:  A routine yearly physical is a good way to check with your health care provider about your health and preventative screening. It is a chance to share any concerns and updates on your health and to receive a thorough exam.  Visit your dentist for a routine exam and preventative care every 6 months. Brush your teeth twice a day and floss once a day. Good oral hygiene prevents tooth decay and gum disease.  The frequency of eye exams is based on your age, health, family medical history, use of contact lenses, and other factors. Follow your health care provider's recommendations for frequency of eye exams.  Eat a healthy diet. Foods such as vegetables, fruits, whole grains, low-fat dairy products, and lean protein foods contain the nutrients you need without too many calories. Decrease your intake of foods high in solid fats, added sugars, and salt. Eat the right amount of calories for you.Get information about a proper diet from your health care provider, if necessary.  Regular physical exercise is one of the most important things you can do for your health. Most adults should get at least 150 minutes of moderate-intensity exercise (any activity that increases your heart rate and causes you to sweat) each week. In addition, most adults need muscle-strengthening exercises on 2 or more days a week.  Maintain a healthy weight. The body mass index (BMI) is a screening tool to identify possible weight problems. It provides an estimate of body fat based on height and weight. Your health care provider can find your BMI and can help you achieve or maintain a healthy weight.For adults 20 years and older:  A BMI below 18.5 is considered underweight.  A BMI of 18.5 to 24.9 is normal.  A BMI of 25 to 29.9 is considered overweight.  A BMI  of 30 and above is considered obese.  Maintain normal blood lipids and cholesterol levels by exercising and minimizing your intake of saturated fat. Eat a balanced diet with plenty of fruit and vegetables. Blood tests for lipids and cholesterol should begin at age 20 and be repeated every 5 years. If your lipid or cholesterol levels are high, you are over 50, or you are at high risk for heart disease, you may need your cholesterol levels checked more frequently.Ongoing high lipid and cholesterol levels should be treated with medicines if diet and exercise are not working.  If you smoke, find out from your health care provider how to quit. If you do not use tobacco, do not start.  Lung cancer screening is recommended for adults aged 55-80 years who are at high risk for developing lung cancer because of a history of smoking. A yearly low-dose CT scan of the lungs is recommended for people who have at least a 30-pack-year history of smoking and are a current smoker or have quit within the past 15 years. A pack year of smoking is smoking an average of 1 pack of cigarettes a day for 1 year (for example: 1 pack a day for 30 years or 2 packs a day for 15 years). Yearly screening should continue until the smoker has stopped smoking for at least 15 years. Yearly screening should be stopped for people who develop a health problem that would prevent them from having lung cancer treatment.  If you choose to drink alcohol, do not have more than   2 drinks per day. One drink is considered to be 12 ounces (355 mL) of beer, 5 ounces (148 mL) of wine, or 1.5 ounces (44 mL) of liquor.  Avoid use of street drugs. Do not share needles with anyone. Ask for help if you need support or instructions about stopping the use of drugs.  High blood pressure causes heart disease and increases the risk of stroke. Your blood pressure should be checked at least every 1-2 years. Ongoing high blood pressure should be treated with  medicines, if weight loss and exercise are not effective.  If you are 45-79 years old, ask your health care provider if you should take aspirin to prevent heart disease.  Diabetes screening involves taking a blood sample to check your fasting blood sugar level. Testing should be considered at a younger age or be carried out more frequently if you are overweight and have at least 1 risk factor for diabetes.  Colorectal cancer can be detected and often prevented. Most routine colorectal cancer screening begins at the age of 50 and continues through age 75. However, your health care provider may recommend screening at an earlier age if you have risk factors for colon cancer. On a yearly basis, your health care provider may provide home test kits to check for hidden blood in the stool. Use of a small camera at the end of a tube to directly examine the colon (sigmoidoscopy or colonoscopy) can detect the earliest forms of colorectal cancer. Talk to your health care provider about this at age 50, when routine screening begins. Direct exam of the colon should be repeated every 5-10 years through age 75, unless early forms of precancerous polyps or small growths are found.  Hepatitis C blood testing is recommended for all people born from 1945 through 1965 and any individual with known risks for hepatitis C.  New guidelines recommend a once time screening for HIV.   Screening for abdominal aortic aneurysm (AAA)  by ultrasound is recommended for people who have history of high blood pressure or who are current or former smokers.  Healthy men should  receive prostate-specific antigen (PSA) blood tests as part of routine cancer screening. Talk with your health care provider about prostate cancer screening.  Testicular cancer screening is  recommended for adult males. Screening includes self-exam, a health care provider exam, and other screening tests. Consult with your health care provider about any symptoms you  have or any concerns you have about testicular cancer.  Use sunscreen. Apply sunscreen liberally and repeatedly throughout the day. You should seek shade when your shadow is shorter than you. Protect yourself by wearing long sleeves, pants, a wide-brimmed hat, and sunglasses year round, whenever you are outdoors.  Once a month, do a whole-body skin exam, using a mirror to look at the skin on your back. Tell your health care provider about new moles, moles that have irregular borders, moles that are larger than a pencil eraser, or moles that have changed in shape or color.  Stay current with required vaccines (immunizations).  Influenza vaccine. All adults should be immunized every year.  Tetanus, diphtheria, and acellular pertussis (Td, Tdap) vaccine. An adult who has not previously received Tdap or who does not know his vaccine status should receive 1 dose of Tdap. This initial dose should be followed by tetanus and diphtheria toxoids (Td) booster doses every 10 years. Adults with an unknown or incomplete history of completing a 3-dose immunization series with Td-containing vaccines should begin or   complete a primary immunization series including a Tdap dose. Adults should receive a Td booster every 10 years.  Zoster vaccine. One dose is recommended for adults aged 60 years or older unless certain conditions are present.    PREVNAR - Pneumococcal 13-valent conjugate (PCV13) vaccine. When indicated, a person who is uncertain of his immunization history and has no record of immunization should receive the PCV13 vaccine. An adult aged 19 years or older who has certain medical conditions and has not been previously immunized should receive 1 dose of PCV13 vaccine. This PCV13 should be followed with a dose of pneumococcal polysaccharide (PPSV23) vaccine. The PPSV23 vaccine dose should be obtained at least 8 weeks after the dose of PCV13 vaccine. An adult aged 19 years or older who has certain medical  conditions and previously received 1 or more doses of PPSV23 vaccine should receive 1 dose of PCV13. The PCV13 vaccine dose should be obtained 1 or more years after the last PPSV23 vaccine dose.    PNEUMOVAX - Pneumococcal polysaccharide (PPSV23) vaccine. When PCV13 is also indicated, PCV13 should be obtained first. All adults aged 65 years and older should be immunized. An adult younger than age 65 years who has certain medical conditions should be immunized. Any person who resides in a nursing home or long-term care facility should be immunized. An adult smoker should be immunized. People with an immunocompromised condition and certain other conditions should receive both PCV13 and PPSV23 vaccines. People with human immunodeficiency virus (HIV) infection should be immunized as soon as possible after diagnosis. Immunization during chemotherapy or radiation therapy should be avoided. Routine use of PPSV23 vaccine is not recommended for American Indians, [place] Natives, or people younger than 65 years unless there are medical conditions that require PPSV23 vaccine. When indicated, people who have unknown immunization and have no record of immunization should receive PPSV23 vaccine. One-time revaccination 5 years after the first dose of PPSV23 is recommended for people aged 19-64 years who have chronic kidney failure, nephrotic syndrome, asplenia, or immunocompromised conditions. People who received 1-2 doses of PPSV23 before age 65 years should receive another dose of PPSV23 vaccine at age 65 years or later if at least 5 years have passed since the previous dose. Doses of PPSV23 are not needed for people immunized with PPSV23 at or after age 65 years.    Hepatitis A vaccine. Adults who wish to be protected from this disease, have certain high-risk conditions, work with hepatitis A-infected animals, work in hepatitis A research labs, or travel to or work in countries with a high rate of hepatitis A should be  immunized. Adults who were previously unvaccinated and who anticipate close contact with an international adoptee during the first 60 days after arrival in the [place] from a country with a high rate of hepatitis A should be immunized.    Hepatitis B vaccine. Adults should be immunized if they wish to be protected from this disease, have certain high-risk conditions, may be exposed to blood or other infectious body fluids, are household contacts or sex partners of hepatitis B positive people, are clients or workers in certain care facilities, or travel to or work in countries with a high rate of hepatitis B.   Preventive Service / Frequency   Ages 65 and over  Blood pressure check.  Lipid and cholesterol check.  Lung cancer screening. / Every year if you are aged 55-80 years and have a 30-pack-year history of smoking and currently smoke or have quit   within the past 15 years. Yearly screening is stopped once you have quit smoking for at least 15 years or develop a health problem that would prevent you from having lung cancer treatment.  Fecal occult blood test (FOBT) of stool. You may not have to do this test if you get a colonoscopy every 10 years.  Flexible sigmoidoscopy** or colonoscopy.** / Every 5 years for a flexible sigmoidoscopy or every 10 years for a colonoscopy beginning at age 50 and continuing until age 75.  Hepatitis C blood test.** / For all people born from 1945 through 1965 and any individual with known risks for hepatitis C.  Abdominal aortic aneurysm (AAA) screening./ Screening current or former smokers or have Hypertension.  Skin self-exam. / Monthly.  Influenza vaccine. / Every year.  Tetanus, diphtheria, and acellular pertussis (Tdap/Td) vaccine.** / 1 dose of Td every 10 years.   Zoster vaccine.** / 1 dose for adults aged 60 years or older.         Pneumococcal 13-valent conjugate (PCV13) vaccine.    Pneumococcal polysaccharide (PPSV23) vaccine.      Hepatitis A vaccine.** / Consult your health care provider.  Hepatitis B vaccine.** / Consult your health care provider. Screening for abdominal aortic aneurysm (AAA)  by ultrasound is recommended for people who have history of high blood pressure or who are current or former smokers.   

## 2016-10-31 NOTE — Progress Notes (Signed)
CPE and MEDICARE WELLNESS  Assessment:    Essential hypertension - continue medications, DASH diet, exercise and monitor at home. Call if greater than 130/80.  -     CBC with Differential/Platelet -     BASIC METABOLIC PANEL WITH GFR -     Hepatic function panel -     TSH -     Urinalysis, Routine w reflex microscopic -     Microalbumin / creatinine urine ratio -     EKG 12-Lead  Sinus bradycardia, chronic Asymptomatic, will continue to monitor.   Gastroesophageal reflux disease without esophagitis Continue PPI/H2 blocker, diet discussed  Hyperlipidemia, unspecified hyperlipidemia type -continue medications, check lipids, decrease fatty foods, increase activity.  -     Lipid panel  Vitamin D deficiency -     VITAMIN D 25 Hydroxy (Vit-D Deficiency, Fractures)  Medication management -     Magnesium  BPH without obstruction/lower urinary tract symptoms -     PSA  Encounter for Medicare annual wellness exam Out of prevnar 13   Future Appointments Date Time Provider Department Center  11/02/2017 9:00 AM Quentin MullingAmanda Collier, PA-C [id number] None   Plan:   During the course of the visit the patient was educated and counseled about appropriate screening and preventive services including:    Pneumococcal vaccine   Influenza vaccine  Td vaccine  Screening electrocardiogram  Bone densitometry screening  Colorectal cancer screening  Diabetes screening  Glaucoma screening  Nutrition counseling   Advanced directives: requested   Subjective:  Carlos Green is a 75 y.o. male who presents for  complete physical.     His blood pressure has been controlled at home, today their BP is BP: 126/74 He does workout but works at the [employer], 1 x a week, and walks 2 miles daily He denies chest pain, shortness of breath, dizziness.  He is not on cholesterol medication and denies myalgias. His cholesterol is at goal. The cholesterol last visit was:   Lab Results   Component Value Date   CHOL 194 10/30/2015   HDL 72 10/30/2015   LDLCALC 112 10/30/2015   TRIG 49 10/30/2015   CHOLHDL 2.7 10/30/2015   Patient is not on Vitamin D supplement.   Lab Results  Component Value Date   VD25OH 40 10/24/2014     Lab Results  Component Value Date   PSA 0.56 10/30/2015   PSA 0.72 10/24/2014   PSA 0.57 10/24/2013    Names of Other Physician/Practitioners you currently use: 1. [facility] here for primary care 2. [facility], eye doctor, 1 year ago, wears glasses 3. none, dentist Patient Care Team: Lucky CowboyWilliam McKeown, MD as PCP - General (Internal Medicine)  Medication Review: No current outpatient prescriptions on file prior to visit.   No current facility-administered medications on file prior to visit.     Current Problems (verified) Patient Active Problem List   Diagnosis Date Noted  . Encounter for Medicare annual wellness exam 10/30/2015  . Sinus bradycardia, chronic 10/30/2015  . Hypertension   . Hyperlipidemia   . GERD (gastroesophageal reflux disease)   . Vitamin D deficiency     Screening Tests Immunization History  Administered Date(s) Administered  . Pneumococcal-Unspecified 11/21/2005  . Td 11/21/2010    Preventative care: Tetanus: 09/2011 Pneumovax:01/12/2006 Prevnar declines, out of in the office Flu vaccine: declines Zostavax: declines  DEXA: declines Colonoscopy: 2009 due 2019 EGD: 2009  Allergies No Known Allergies  SURGICAL HISTORY He  has  a past surgical history that includes Appendectomy; Hernia repair (Right, 2010); and Tonsillectomy and adenoidectomy. FAMILY HISTORY His family history includes Cancer in his sister; Diabetes in his mother; Hypertension in his father; Stroke in his mother. SOCIAL HISTORY He  reports that he quit smoking about 25 years ago. He has never used smokeless tobacco. He reports that he does not drink alcohol or use drugs.  MEDICARE WELLNESS  OBJECTIVES: Physical activity: Current Exercise Habits: Home exercise routine, Type of exercise: walking, Time (Minutes): 30, Frequency (Times/Week): 5, Weekly Exercise (Minutes/Week): 150, Intensity: Mild Cardiac risk factors: Cardiac Risk Factors include: advanced age (>2855men, 3>65 women);hypertension;male gender Depression/mood screen:   [facility] 10/31/2016  Decreased Interest 0  Down, Depressed, Hopeless 0  PHQ - 2 Score 0    ADLs:  In your present state of health, do you have any difficulty performing the following activities: 10/31/2016  Hearing? N  Vision? N  Difficulty concentrating or making decisions? N  Walking or climbing stairs? N  Dressing or bathing? N  Doing errands, shopping? N  Some recent data might be hidden     Cognitive Testing  Alert? Yes  Normal Appearance?Yes  Oriented to person? Yes  Place? Yes   Time? Yes  Recall of three objects?  Yes  Can perform simple calculations? Yes  Displays appropriate judgment?Yes  Can read the correct time from a watch face?Yes  EOL planning: Does Patient Have a Medical Advance Directive?: No Would patient like information on creating a medical advance directive?: No - Patient declined    Review of Systems  Constitutional: Negative.   HENT: Negative.   Eyes: Negative.   Respiratory: Negative.   Cardiovascular: Negative.   Gastrointestinal: Negative.   Genitourinary: Negative.   Musculoskeletal: Negative.   Skin: Negative.   Neurological: Negative.   Endo/Heme/Allergies: Negative.   Psychiatric/Behavioral: Negative.       Objective:     Blood pressure 126/74, pulse 80, temperature 97.9 F (36.6 C), resp. rate 14, height 5\' 6"  (1.676 m), weight 144 lb 12.8 oz (65.7 kg), SpO2 97 %. Body mass index is 23.37 kg/m.  General appearance: alert, no distress, WD/WN, male HEENT: normocephalic, sclerae anicteric, TMs pearly, nares patent, no discharge or erythema, pharynx normal Oral cavity: MMM, no  lesions Neck: supple, no lymphadenopathy, no thyromegaly, no masses Heart: RRR, normal S1, S2, no murmurs Lungs: CTA bilaterally, no wheezes, rhonchi, or rales Abdomen: +bs, soft, non tender, non distended, no masses, no hepatomegaly, no splenomegaly Musculoskeletal: nontender, no swelling, no obvious deformity Extremities: no edema, no cyanosis, no clubbing Pulses: 2+ symmetric, upper and lower extremities, normal cap refill Neurological: alert, oriented x 3, CN2-12 intact, strength normal upper extremities and lower extremities, sensation normal throughout, DTRs 2+ throughout, no cerebellar signs, gait normal Skin: several erythematous, scaly plaques on arms Psychiatric: normal affect, behavior normal, pleasant     Medicare Attestation I have personally reviewed: The patient's medical and social history Their use of alcohol, tobacco or illicit drugs Their current medications and supplements The patient's functional ability including ADLs,fall risks, home safety risks, cognitive, and hearing and visual impairment Diet and physical activities Evidence for depression or mood disorders  The patient's weight, height, BMI, and visual acuity have been recorded in the chart.  I have made referrals, counseling, and provided education to the patient based on review of the above and I have provided the patient with a written personalized care plan for preventive services.     Quentin MullingAmanda Collier, PA-C  10/31/2016   

## 2016-11-01 LAB — BASIC METABOLIC PANEL WITH GFR
BUN: 16 mg/dL (ref 7–25)
CHLORIDE: 108 mmol/L (ref 98–110)
CO2: 26 mmol/L (ref 20–31)
CREATININE: 1.05 mg/dL (ref 0.70–1.18)
Calcium: 9.1 mg/dL (ref 8.6–10.3)
GFR, Est African American: 80 mL/min (ref >=60)
GFR, Est Non African American: 69 mL/min (ref >=60)
GLUCOSE: 62 mg/dL — AB (ref 65–99)
POTASSIUM: 4.5 mmol/L (ref 3.5–5.3)
Sodium: 142 mmol/L (ref 135–146)

## 2016-11-01 LAB — URINALYSIS, ROUTINE W REFLEX MICROSCOPIC
Bilirubin Urine: NEGATIVE
GLUCOSE, UA: NEGATIVE
HGB URINE DIPSTICK: NEGATIVE
KETONES UR: NEGATIVE
LEUKOCYTES UA: NEGATIVE
NITRITE: NEGATIVE
PH: 6 (ref 5.0–8.0)
PROTEIN: NEGATIVE
Specific Gravity, Urine: 1.008 (ref 1.001–1.035)

## 2016-11-01 LAB — LIPID PANEL
Cholesterol: 173 mg/dL (ref <200)
HDL: 68 mg/dL (ref >40)
LDL CALC: 87 mg/dL (ref <100)
Total CHOL/HDL Ratio: 2.5 Ratio (ref <5.0)
Triglycerides: 90 mg/dL (ref <150)
VLDL: 18 mg/dL (ref <30)

## 2016-11-01 LAB — MICROALBUMIN / CREATININE URINE RATIO: Creatinine, Urine: 50 mg/dL (ref 20–370)

## 2016-11-01 LAB — HEPATIC FUNCTION PANEL
ALBUMIN: 4 g/dL (ref 3.6–5.1)
ALK PHOS: 60 U/L (ref 40–115)
ALT: 10 U/L (ref 9–46)
AST: 17 U/L (ref 10–35)
Bilirubin, Direct: 0.1 mg/dL (ref <=0.2)
Indirect Bilirubin: 0.5 mg/dL (ref 0.2–1.2)
TOTAL PROTEIN: 6.3 g/dL (ref 6.1–8.1)
Total Bilirubin: 0.6 mg/dL (ref 0.2–1.2)

## 2016-11-01 LAB — MAGNESIUM: MAGNESIUM: 1.8 mg/dL (ref 1.5–2.5)

## 2016-11-01 LAB — VITAMIN D 25 HYDROXY (VIT D DEFICIENCY, FRACTURES): VIT D 25 HYDROXY: 42 ng/mL (ref 30–100)

## 2016-11-07 DIAGNOSIS — H524 Presbyopia: Secondary | ICD-10-CM | POA: Diagnosis not present

## 2016-11-08 DIAGNOSIS — H4912 Fourth [trochlear] nerve palsy, left eye: Secondary | ICD-10-CM | POA: Diagnosis not present

## 2016-11-29 DIAGNOSIS — H4912 Fourth [trochlear] nerve palsy, left eye: Secondary | ICD-10-CM | POA: Diagnosis not present

## 2017-03-30 DIAGNOSIS — R69 Illness, unspecified: Secondary | ICD-10-CM | POA: Diagnosis not present

## 2017-10-03 DIAGNOSIS — R69 Illness, unspecified: Secondary | ICD-10-CM | POA: Diagnosis not present

## 2017-11-01 NOTE — Progress Notes (Signed)
CPE and MEDICARE WELLNESS  Assessment:    Essential hypertension - continue medications, DASH diet, exercise and monitor at home. Call if greater than 130/80.  -     CBC with Differential/Platelet -     BASIC METABOLIC PANEL WITH GFR -     Hepatic function panel -     TSH -     Urinalysis, Routine w reflex microscopic -     Microalbumin / creatinine urine ratio -     EKG 12-Lead  Sinus bradycardia, chronic Asymptomatic, will continue to monitor.   Gastroesophageal reflux disease without esophagitis Continue PPI/H2 blocker, diet discussed  Hyperlipidemia, unspecified hyperlipidemia type -continue medications, check lipids, decrease fatty foods, increase activity.  -     Lipid panel  Vitamin D deficiency -     VITAMIN D 25 Hydroxy (Vit-D Deficiency, Fractures)  Medication management -     Magnesium  BPH without obstruction/lower urinary tract symptoms -     PSA  Encounter for Medicare annual wellness exam 1 year  Need for prophylactic vaccination against Streptococcus pneumoniae (pneumococcus) -     Pneumococcal conjugate vaccine 13-valent IM  Advanced care planning/counseling discussion Discussed advanced care planning with patient and/or family At least 30 mins discussed with the patient and/or family at the visit.    Future Appointments  Date Time Provider Department Center  11/07/2018  9:00 AM Quentin Mullingollier, Malynda Smolinski, PA-C [facility] None   Plan:   During the course of the visit the patient was educated and counseled about appropriate screening and preventive services including:    Pneumococcal vaccine   Influenza vaccine  Td vaccine  Screening electrocardiogram  Bone densitometry screening  Colorectal cancer screening  Diabetes screening  Glaucoma screening  Nutrition counseling   Advanced directives: requested   Subjective:  Carlos Green is a 76 y.o. male who presents for medicare wellness and follow up.    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  He is not on cholesterol medication and denies myalgias. His cholesterol is at goal. The cholesterol last visit was:   Lab Results  Component Value Date   CHOL 173 10/31/2016   HDL 68 10/31/2016   LDLCALC 87 10/31/2016   TRIG 90 10/31/2016   CHOLHDL 2.5 10/31/2016   Patient is not on Vitamin D supplement.   Lab Results  Component Value Date   VD25OH 42 10/31/2016     Lab Results  Component Value Date   PSA 0.8 10/31/2016   PSA 0.56 10/30/2015   PSA 0.72 10/24/2014   BMI is Body mass index is 22.05 kg/m., he is working on diet and exercise. Wt Readings from Last 3 Encounters:  11/02/17 140 lb 12.8 oz (63.9 kg)  10/31/16 144 lb 12.8 oz (65.7 kg)  10/30/15 138 lb (62.6 kg)    Names of Other Physician/Practitioners you currently use: 1. [facility] here for primary care 2. [facility], eye doctor, 3 years ago, wears glasses 3. none, dentist Patient Care Team: Lucky CowboyMcKeown, William, MD as PCP - General (Internal Medicine)  Medication Review: No current outpatient medications on file prior to visit.   No current facility-administered medications on file prior to visit.     Current Problems (verified) Patient Active Problem List   Diagnosis Date Noted  . Encounter  for Medicare annual wellness exam 10/30/2015  . Sinus bradycardia, chronic 10/30/2015  . Hypertension   . Hyperlipidemia   . GERD (gastroesophageal reflux disease)   . Vitamin D deficiency     Screening Tests Immunization History  Administered Date(s) Administered  . Pneumococcal Conjugate-13 11/02/2017  . Pneumococcal-Unspecified 11/21/2005  . Td 11/21/2010    Preventative care: Tetanus: 09/2011 Pneumovax:01/12/2006  Prevnar 2018 Flu vaccine: declines Zostavax: declines  DEXA:  declines Colonoscopy: 2009 due 2019 does not want colonoscopy will discuss cologuard again next year EGD: 2009 Ct chest 09/2008 CxR 10/2013 hyperinflation  Allergies No Known Allergies  SURGICAL HISTORY He  has a past surgical history that includes Appendectomy; Hernia repair (Right, 2010); and Tonsillectomy and adenoidectomy. FAMILY HISTORY His family history includes Cancer in his sister; Diabetes in his mother; Hypertension in his father; Stroke in his mother. SOCIAL HISTORY He  reports that he quit smoking about 26 years ago. he has never used smokeless tobacco. He reports that he does not drink alcohol or use drugs.  MEDICARE WELLNESS OBJECTIVES: Physical activity: Current Exercise Habits: The patient does not participate in regular exercise at present Cardiac risk factors: Cardiac Risk Factors include: advanced age (>6855men, 32>65 women);hypertension;male gender Depression/mood screen:   [facility]  Decreased Interest 0  Down, Depressed, Hopeless 0  PHQ - 2 Score 0    ADLs:  In your present state of health, do you have any difficulty performing the following activities: 11/02/2017  Hearing? N  Vision? N  Difficulty concentrating or making decisions? N  Walking or climbing stairs? N  Dressing or bathing? N  Doing errands, shopping? N  Some recent data might be hidden     Cognitive Testing  Alert? Yes  Normal Appearance?Yes  Oriented to person? Yes  Place? Yes   Time? Yes  Recall of three objects?  Yes  Can perform simple calculations? Yes  Displays appropriate judgment?Yes  Can read the correct time from a watch face?Yes  EOL planning: Does Patient Have a Medical Advance Directive?: No Would patient like information on creating a medical advance directive?: No - Patient declined Discussed with patient, has discussed with his daughter that is [phone] an a nurse, states she knows his wishes.    Review of Systems  Constitutional: Negative.    HENT: Negative.   Eyes: Negative.   Respiratory: Negative.   Cardiovascular: Negative.   Gastrointestinal: Negative.   Genitourinary: Negative.   Musculoskeletal: Negative.   Skin: Negative.   Neurological: Negative.   Endo/Heme/Allergies: Negative.   Psychiatric/Behavioral: Negative.       Objective:     Blood pressure 116/74, temperature (!) 97.3 F (36.3 C), height 5\' 7"  (1.702 m), weight 140 lb 12.8 oz (63.9 kg). Body mass index is 22.05 kg/m.  General appearance: alert, no distress, WD/WN, male HEENT: normocephalic, sclerae anicteric, TMs pearly, nares patent, no discharge or erythema, pharynx normal Oral cavity: MMM, no lesions Neck: supple, no lymphadenopathy, no thyromegaly, no masses Heart: RRR, normal S1, S2, no murmurs Lungs: CTA bilaterally, no wheezes, rhonchi, or rales Abdomen: +bs, soft, non tender, non distended, no masses, no hepatomegaly, no splenomegaly Musculoskeletal: nontender, no swelling, no obvious deformity Extremities: no edema, no cyanosis, no clubbing Pulses: 2+ symmetric, upper and lower extremities, normal cap refill Neurological: alert, oriented x 3, CN2-12 intact, strength normal upper extremities and lower extremities, sensation normal throughout, DTRs 2+ throughout, no cerebellar signs, gait normal Skin: several erythematous, scaly plaques on arms Psychiatric: normal affect, behavior  normal, pleasant     Medicare Attestation I have personally reviewed: The patient's medical and social history Their use of alcohol, tobacco or illicit drugs Their current medications and supplements The patient's functional ability including ADLs,fall risks, home safety risks, cognitive, and hearing and visual impairment Diet and physical activities Evidence for depression or mood disorders  The patient's weight, height, BMI, and visual acuity have been recorded in the chart.  I have made referrals, counseling, and provided education to the patient based  on review of the above and I have provided the patient with a written personalized care plan for preventive services.     Quentin Mulling, PA-C   11/02/2017

## 2017-11-02 ENCOUNTER — Encounter: Payer: Self-pay | Admitting: Physician Assistant

## 2017-11-02 ENCOUNTER — Ambulatory Visit (INDEPENDENT_AMBULATORY_CARE_PROVIDER_SITE_OTHER): Payer: Medicare HMO | Admitting: Physician Assistant

## 2017-11-02 VITALS — BP 116/74 | Temp 97.3°F | Ht 67.0 in | Wt 140.8 lb

## 2017-11-02 DIAGNOSIS — Z79899 Other long term (current) drug therapy: Secondary | ICD-10-CM | POA: Diagnosis not present

## 2017-11-02 DIAGNOSIS — R001 Bradycardia, unspecified: Secondary | ICD-10-CM

## 2017-11-02 DIAGNOSIS — I10 Essential (primary) hypertension: Secondary | ICD-10-CM | POA: Diagnosis not present

## 2017-11-02 DIAGNOSIS — Z6822 Body mass index (BMI) 22.0-22.9, adult: Secondary | ICD-10-CM

## 2017-11-02 DIAGNOSIS — K219 Gastro-esophageal reflux disease without esophagitis: Secondary | ICD-10-CM | POA: Diagnosis not present

## 2017-11-02 DIAGNOSIS — Z7189 Other specified counseling: Secondary | ICD-10-CM | POA: Diagnosis not present

## 2017-11-02 DIAGNOSIS — Z136 Encounter for screening for cardiovascular disorders: Secondary | ICD-10-CM

## 2017-11-02 DIAGNOSIS — E559 Vitamin D deficiency, unspecified: Secondary | ICD-10-CM

## 2017-11-02 DIAGNOSIS — R6889 Other general symptoms and signs: Secondary | ICD-10-CM

## 2017-11-02 DIAGNOSIS — Z0000 Encounter for general adult medical examination without abnormal findings: Secondary | ICD-10-CM

## 2017-11-02 DIAGNOSIS — E785 Hyperlipidemia, unspecified: Secondary | ICD-10-CM | POA: Diagnosis not present

## 2017-11-02 DIAGNOSIS — Z0001 Encounter for general adult medical examination with abnormal findings: Secondary | ICD-10-CM | POA: Diagnosis not present

## 2017-11-02 DIAGNOSIS — N400 Enlarged prostate without lower urinary tract symptoms: Secondary | ICD-10-CM

## 2017-11-02 DIAGNOSIS — Z23 Encounter for immunization: Secondary | ICD-10-CM

## 2017-11-02 NOTE — Patient Instructions (Signed)
Preventive Care for Adults A healthy lifestyle and preventive care can promote health and wellness. Preventive health guidelines for men include the following key practices:  A routine yearly physical is a good way to check with your health care provider about your health and preventative screening. It is a chance to share any concerns and updates on your health and to receive a thorough exam.  Visit your dentist for a routine exam and preventative care every 6 months. Brush your teeth twice a day and floss once a day. Good oral hygiene prevents tooth decay and gum disease.  The frequency of eye exams is based on your age, health, family medical history, use of contact lenses, and other factors. Follow your health care provider's recommendations for frequency of eye exams.  Eat a healthy diet. Foods such as vegetables, fruits, whole grains, low-fat dairy products, and lean protein foods contain the nutrients you need without too many calories. Decrease your intake of foods high in solid fats, added sugars, and salt. Eat the right amount of calories for you.Get information about a proper diet from your health care provider, if necessary.  Regular physical exercise is one of the most important things you can do for your health. Most adults should get at least 150 minutes of moderate-intensity exercise (any activity that increases your heart rate and causes you to sweat) each week. In addition, most adults need muscle-strengthening exercises on 2 or more days a week.  Maintain a healthy weight. The body mass index (BMI) is a screening tool to identify possible weight problems. It provides an estimate of body fat based on height and weight. Your health care provider can find your BMI and can help you achieve or maintain a healthy weight.For adults 20 years and older:  A BMI below 18.5 is considered underweight.  A BMI of 18.5 to 24.9 is normal.  A BMI of 25 to 29.9 is considered overweight.  A BMI  of 30 and above is considered obese.  Maintain normal blood lipids and cholesterol levels by exercising and minimizing your intake of saturated fat. Eat a balanced diet with plenty of fruit and vegetables. Blood tests for lipids and cholesterol should begin at age 20 and be repeated every 5 years. If your lipid or cholesterol levels are high, you are over 50, or you are at high risk for heart disease, you may need your cholesterol levels checked more frequently.Ongoing high lipid and cholesterol levels should be treated with medicines if diet and exercise are not working.  If you smoke, find out from your health care provider how to quit. If you do not use tobacco, do not start.  Lung cancer screening is recommended for adults aged 55-80 years who are at high risk for developing lung cancer because of a history of smoking. A yearly low-dose CT scan of the lungs is recommended for people who have at least a 30-pack-year history of smoking and are a current smoker or have quit within the past 15 years. A pack year of smoking is smoking an average of 1 pack of cigarettes a day for 1 year (for example: 1 pack a day for 30 years or 2 packs a day for 15 years). Yearly screening should continue until the smoker has stopped smoking for at least 15 years. Yearly screening should be stopped for people who develop a health problem that would prevent them from having lung cancer treatment.  If you choose to drink alcohol, do not have more than   2 drinks per day. One drink is considered to be 12 ounces (355 mL) of beer, 5 ounces (148 mL) of wine, or 1.5 ounces (44 mL) of liquor.  Avoid use of street drugs. Do not share needles with anyone. Ask for help if you need support or instructions about stopping the use of drugs.  High blood pressure causes heart disease and increases the risk of stroke. Your blood pressure should be checked at least every 1-2 years. Ongoing high blood pressure should be treated with  medicines, if weight loss and exercise are not effective.  If you are 45-79 years old, ask your health care provider if you should take aspirin to prevent heart disease.  Diabetes screening involves taking a blood sample to check your fasting blood sugar level. Testing should be considered at a younger age or be carried out more frequently if you are overweight and have at least 1 risk factor for diabetes.  Colorectal cancer can be detected and often prevented. Most routine colorectal cancer screening begins at the age of 50 and continues through age 75. However, your health care provider may recommend screening at an earlier age if you have risk factors for colon cancer. On a yearly basis, your health care provider may provide home test kits to check for hidden blood in the stool. Use of a small camera at the end of a tube to directly examine the colon (sigmoidoscopy or colonoscopy) can detect the earliest forms of colorectal cancer. Talk to your health care provider about this at age 50, when routine screening begins. Direct exam of the colon should be repeated every 5-10 years through age 75, unless early forms of precancerous polyps or small growths are found.  Hepatitis C blood testing is recommended for all people born from 1945 through 1965 and any individual with known risks for hepatitis C.  New guidelines recommend a once time screening for HIV.   Screening for abdominal aortic aneurysm (AAA)  by ultrasound is recommended for people who have history of high blood pressure or who are current or former smokers.  Healthy men should  receive prostate-specific antigen (PSA) blood tests as part of routine cancer screening. Talk with your health care provider about prostate cancer screening.  Testicular cancer screening is  recommended for adult males. Screening includes self-exam, a health care provider exam, and other screening tests. Consult with your health care provider about any symptoms you  have or any concerns you have about testicular cancer.  Use sunscreen. Apply sunscreen liberally and repeatedly throughout the day. You should seek shade when your shadow is shorter than you. Protect yourself by wearing long sleeves, pants, a wide-brimmed hat, and sunglasses year round, whenever you are outdoors.  Once a month, do a whole-body skin exam, using a mirror to look at the skin on your back. Tell your health care provider about new moles, moles that have irregular borders, moles that are larger than a pencil eraser, or moles that have changed in shape or color.  Stay current with required vaccines (immunizations).  Influenza vaccine. All adults should be immunized every year.  Tetanus, diphtheria, and acellular pertussis (Td, Tdap) vaccine. An adult who has not previously received Tdap or who does not know his vaccine status should receive 1 dose of Tdap. This initial dose should be followed by tetanus and diphtheria toxoids (Td) booster doses every 10 years. Adults with an unknown or incomplete history of completing a 3-dose immunization series with Td-containing vaccines should begin or   complete a primary immunization series including a Tdap dose. Adults should receive a Td booster every 10 years.  Zoster vaccine. One dose is recommended for adults aged 60 years or older unless certain conditions are present.    PREVNAR - Pneumococcal 13-valent conjugate (PCV13) vaccine. When indicated, a person who is uncertain of his immunization history and has no record of immunization should receive the PCV13 vaccine. An adult aged 19 years or older who has certain medical conditions and has not been previously immunized should receive 1 dose of PCV13 vaccine. This PCV13 should be followed with a dose of pneumococcal polysaccharide (PPSV23) vaccine. The PPSV23 vaccine dose should be obtained at least 8 weeks after the dose of PCV13 vaccine. An adult aged 19 years or older who has certain medical  conditions and previously received 1 or more doses of PPSV23 vaccine should receive 1 dose of PCV13. The PCV13 vaccine dose should be obtained 1 or more years after the last PPSV23 vaccine dose.    PNEUMOVAX - Pneumococcal polysaccharide (PPSV23) vaccine. When PCV13 is also indicated, PCV13 should be obtained first. All adults aged 65 years and older should be immunized. An adult younger than age 65 years who has certain medical conditions should be immunized. Any person who resides in a nursing home or long-term care facility should be immunized. An adult smoker should be immunized. People with an immunocompromised condition and certain other conditions should receive both PCV13 and PPSV23 vaccines. People with human immunodeficiency virus (HIV) infection should be immunized as soon as possible after diagnosis. Immunization during chemotherapy or radiation therapy should be avoided. Routine use of PPSV23 vaccine is not recommended for American Indians, [place] Natives, or people younger than 65 years unless there are medical conditions that require PPSV23 vaccine. When indicated, people who have unknown immunization and have no record of immunization should receive PPSV23 vaccine. One-time revaccination 5 years after the first dose of PPSV23 is recommended for people aged 19-64 years who have chronic kidney failure, nephrotic syndrome, asplenia, or immunocompromised conditions. People who received 1-2 doses of PPSV23 before age 65 years should receive another dose of PPSV23 vaccine at age 65 years or later if at least 5 years have passed since the previous dose. Doses of PPSV23 are not needed for people immunized with PPSV23 at or after age 65 years.    Hepatitis A vaccine. Adults who wish to be protected from this disease, have certain high-risk conditions, work with hepatitis A-infected animals, work in hepatitis A research labs, or travel to or work in countries with a high rate of hepatitis A should be  immunized. Adults who were previously unvaccinated and who anticipate close contact with an international adoptee during the first 60 days after arrival in the [place] from a country with a high rate of hepatitis A should be immunized.    Hepatitis B vaccine. Adults should be immunized if they wish to be protected from this disease, have certain high-risk conditions, may be exposed to blood or other infectious body fluids, are household contacts or sex partners of hepatitis B positive people, are clients or workers in certain care facilities, or travel to or work in countries with a high rate of hepatitis B.   Preventive Service / Frequency   Ages 65 and over  Blood pressure check.  Lipid and cholesterol check.  Lung cancer screening. / Every year if you are aged 55-80 years and have a 30-pack-year history of smoking and currently smoke or have quit   within the past 15 years. Yearly screening is stopped once you have quit smoking for at least 15 years or develop a health problem that would prevent you from having lung cancer treatment.  Fecal occult blood test (FOBT) of stool. You may not have to do this test if you get a colonoscopy every 10 years.  Flexible sigmoidoscopy** or colonoscopy.** / Every 5 years for a flexible sigmoidoscopy or every 10 years for a colonoscopy beginning at age 50 and continuing until age 75.  Hepatitis C blood test.** / For all people born from 1945 through 1965 and any individual with known risks for hepatitis C.  Abdominal aortic aneurysm (AAA) screening./ Screening current or former smokers or have Hypertension.  Skin self-exam. / Monthly.  Influenza vaccine. / Every year.  Tetanus, diphtheria, and acellular pertussis (Tdap/Td) vaccine.** / 1 dose of Td every 10 years.   Zoster vaccine.** / 1 dose for adults aged 60 years or older.         Pneumococcal 13-valent conjugate (PCV13) vaccine.    Pneumococcal polysaccharide (PPSV23) vaccine.      Hepatitis A vaccine.** / Consult your health care provider.  Hepatitis B vaccine.** / Consult your health care provider. Screening for abdominal aortic aneurysm (AAA)  by ultrasound is recommended for people who have history of high blood pressure or who are current or former smokers.   

## 2017-11-03 LAB — LIPID PANEL
CHOL/HDL RATIO: 2.8 (calc) (ref <5.0)
CHOLESTEROL: 197 mg/dL (ref <200)
HDL: 71 mg/dL (ref >40)
LDL CHOLESTEROL (CALC): 110 mg/dL — AB
NON-HDL CHOLESTEROL (CALC): 126 mg/dL (ref <130)
Triglycerides: 69 mg/dL (ref <150)

## 2017-11-03 LAB — URINALYSIS, ROUTINE W REFLEX MICROSCOPIC
BILIRUBIN URINE: NEGATIVE
Glucose, UA: NEGATIVE
HGB URINE DIPSTICK: NEGATIVE
Ketones, ur: NEGATIVE
LEUKOCYTES UA: NEGATIVE
Nitrite: NEGATIVE
PROTEIN: NEGATIVE
Specific Gravity, Urine: 1.011 (ref 1.001–1.03)
pH: 6.5 (ref 5.0–8.0)

## 2017-11-03 LAB — BASIC METABOLIC PANEL WITH GFR
BUN: 16 mg/dL (ref 7–25)
CO2: 29 mmol/L (ref 20–32)
Calcium: 9.3 mg/dL (ref 8.6–10.3)
Chloride: 103 mmol/L (ref 98–110)
Creat: 1.06 mg/dL (ref 0.70–1.18)
GFR, EST NON AFRICAN AMERICAN: 68 mL/min/{1.73_m2} (ref >=60)
GFR, Est African American: 79 mL/min/{1.73_m2} (ref >=60)
Glucose, Bld: 89 mg/dL (ref 65–99)
Potassium: 4.1 mmol/L (ref 3.5–5.3)
SODIUM: 140 mmol/L (ref 135–146)

## 2017-11-03 LAB — MAGNESIUM: Magnesium: 1.9 mg/dL (ref 1.5–2.5)

## 2017-11-03 LAB — CBC WITH DIFFERENTIAL/PLATELET
BASOS PCT: 1.4 %
Basophils Absolute: 59 cells/uL (ref 0–200)
EOS PCT: 3.8 %
Eosinophils Absolute: 160 cells/uL (ref 15–500)
HEMATOCRIT: 45.5 % (ref 38.5–50.0)
HEMOGLOBIN: 14.9 g/dL (ref 13.2–17.1)
LYMPHS ABS: 962 {cells}/uL (ref 850–3900)
MCH: 29 pg (ref 27.0–33.0)
MCHC: 32.7 g/dL (ref 32.0–36.0)
MCV: 88.7 fL (ref 80.0–100.0)
MPV: 11 fL (ref 7.5–12.5)
Monocytes Relative: 10.6 %
NEUTROS ABS: 2575 {cells}/uL (ref 1500–7800)
Neutrophils Relative %: 61.3 %
Platelets: 183 10*3/uL (ref 140–400)
RBC: 5.13 10*6/uL (ref 4.20–5.80)
RDW: 11.9 % (ref 11.0–15.0)
Total Lymphocyte: 22.9 %
WBC: 4.2 10*3/uL (ref 3.8–10.8)
WBCMIX: 445 {cells}/uL (ref 200–950)

## 2017-11-03 LAB — PSA: PSA: 0.6 ng/mL (ref <=4.0)

## 2017-11-03 LAB — HEPATIC FUNCTION PANEL
AG RATIO: 1.8 (calc) (ref 1.0–2.5)
ALKALINE PHOSPHATASE (APISO): 57 U/L (ref 40–115)
ALT: 12 U/L (ref 9–46)
AST: 18 U/L (ref 10–35)
Albumin: 4.1 g/dL (ref 3.6–5.1)
BILIRUBIN INDIRECT: 0.5 mg/dL (ref 0.2–1.2)
BILIRUBIN TOTAL: 0.6 mg/dL (ref 0.2–1.2)
Bilirubin, Direct: 0.1 mg/dL (ref 0.0–0.2)
GLOBULIN: 2.3 g/dL (ref 1.9–3.7)
TOTAL PROTEIN: 6.4 g/dL (ref 6.1–8.1)

## 2017-11-03 LAB — VITAMIN D 25 HYDROXY (VIT D DEFICIENCY, FRACTURES): VIT D 25 HYDROXY: 46 ng/mL (ref 30–100)

## 2017-11-03 LAB — MICROALBUMIN / CREATININE URINE RATIO
Creatinine, Urine: 56 mg/dL (ref 20–320)
MICROALB UR: 0.3 mg/dL
MICROALB/CREAT RATIO: 5 ug/mg{creat} (ref <30)

## 2017-11-03 LAB — TSH: TSH: 2.07 mIU/L (ref 0.40–4.50)

## 2017-11-03 NOTE — Progress Notes (Signed)
Pt aware of lab results & voiced understanding of those results.

## 2018-02-03 DIAGNOSIS — R69 Illness, unspecified: Secondary | ICD-10-CM | POA: Diagnosis not present

## 2018-10-08 DIAGNOSIS — R69 Illness, unspecified: Secondary | ICD-10-CM | POA: Diagnosis not present

## 2018-11-02 ENCOUNTER — Encounter: Payer: Self-pay | Admitting: Physician Assistant

## 2018-11-02 ENCOUNTER — Ambulatory Visit (INDEPENDENT_AMBULATORY_CARE_PROVIDER_SITE_OTHER): Payer: Medicare HMO | Admitting: Physician Assistant

## 2018-11-02 VITALS — BP 112/60 | HR 82 | Temp 97.8°F | Ht 67.0 in | Wt 136.8 lb

## 2018-11-02 DIAGNOSIS — Z0000 Encounter for general adult medical examination without abnormal findings: Secondary | ICD-10-CM

## 2018-11-02 DIAGNOSIS — D509 Iron deficiency anemia, unspecified: Secondary | ICD-10-CM | POA: Diagnosis not present

## 2018-11-02 DIAGNOSIS — E785 Hyperlipidemia, unspecified: Secondary | ICD-10-CM | POA: Diagnosis not present

## 2018-11-02 DIAGNOSIS — E559 Vitamin D deficiency, unspecified: Secondary | ICD-10-CM | POA: Diagnosis not present

## 2018-11-02 DIAGNOSIS — R6889 Other general symptoms and signs: Secondary | ICD-10-CM | POA: Diagnosis not present

## 2018-11-02 DIAGNOSIS — R001 Bradycardia, unspecified: Secondary | ICD-10-CM

## 2018-11-02 DIAGNOSIS — E538 Deficiency of other specified B group vitamins: Secondary | ICD-10-CM

## 2018-11-02 DIAGNOSIS — Z0001 Encounter for general adult medical examination with abnormal findings: Secondary | ICD-10-CM | POA: Diagnosis not present

## 2018-11-02 DIAGNOSIS — I10 Essential (primary) hypertension: Secondary | ICD-10-CM

## 2018-11-02 DIAGNOSIS — K219 Gastro-esophageal reflux disease without esophagitis: Secondary | ICD-10-CM

## 2018-11-02 DIAGNOSIS — Z79899 Other long term (current) drug therapy: Secondary | ICD-10-CM | POA: Diagnosis not present

## 2018-11-02 DIAGNOSIS — N400 Enlarged prostate without lower urinary tract symptoms: Secondary | ICD-10-CM

## 2018-11-02 DIAGNOSIS — Z136 Encounter for screening for cardiovascular disorders: Secondary | ICD-10-CM | POA: Diagnosis not present

## 2018-11-02 DIAGNOSIS — K4090 Unilateral inguinal hernia, without obstruction or gangrene, not specified as recurrent: Secondary | ICD-10-CM

## 2018-11-02 DIAGNOSIS — Z1211 Encounter for screening for malignant neoplasm of colon: Secondary | ICD-10-CM

## 2018-11-02 DIAGNOSIS — M5442 Lumbago with sciatica, left side: Secondary | ICD-10-CM

## 2018-11-02 MED ORDER — PREDNISONE 20 MG PO TABS: ORAL_TABLET | ORAL | 0 refills | Status: AC

## 2018-11-02 NOTE — Progress Notes (Signed)
MEDICARE WELLNESS  Assessment:   Left inguinal hernia Has had repair on the right, likely hernia on the left, will refer to Dr. Daphine Deutscher for evaluation ER precautions given about hernia -     Ambulatory referral to General Surgery  Acute left-sided low back pain with left-sided sciatica Neg straight leg raise, no red flags If persist will get Xray Go to the ER if you have any new weakness in your legs, have trouble controlling your urine or bowels, or have worsening pain.  -     predniSONE (DELTASONE) 20 MG tablet; 1 pill 3 x a day for 3 days, 1 pill 2 x a day x 3 days, 1 pill a day x 5 days with food  Iron deficiency anemia, unspecified iron deficiency anemia type -     Iron,Total/Total Iron Binding Cap  B12 deficiency -     Vitamin B12  Screen for colon cancer -     Cologuard  Essential hypertension - continue medications, DASH diet, exercise and monitor at home. Call if greater than 130/80.  -     CBC with Differential/Platelet -     BASIC METABOLIC PANEL WITH GFR -     Hepatic function panel -     TSH -     Urinalysis, Routine w reflex microscopic -     Microalbumin / creatinine urine ratio -     EKG 12-Lead  Sinus bradycardia, chronic Asymptomatic, will continue to monitor.   Gastroesophageal reflux disease without esophagitis Continue PPI/H2 blocker, diet discussed  Hyperlipidemia, unspecified hyperlipidemia type -continue medications, check lipids, decrease fatty foods, increase activity.  -     Lipid panel  Vitamin D deficiency -     VITAMIN D 25 Hydroxy (Vit-D Deficiency, Fractures)  Medication management -     Magnesium  BPH without obstruction/lower urinary tract symptoms -     Due to age, will not check PSA   Encounter for Medicare annual wellness exam 1 year    Future Appointments  Date Time Provider Department Center  04/04/2019 10:00 AM [name], MD [facility] None   Plan:   During the course of the visit the patient was  educated and counseled about appropriate screening and preventive services including:    Pneumococcal vaccine   Influenza vaccine  Td vaccine  Screening electrocardiogram  Bone densitometry screening  Colorectal cancer screening  Diabetes screening  Glaucoma screening  Nutrition counseling   Advanced directives: requested   Subjective:  Carlos Green is a 77 y.o. male who presents for medicare wellness and follow up.   He states x over a month he has had "pulled muscle" has taken prednisone, aleve, ibuprofen a few days sporadically. He states there was no injury, no falls, he worked Wednesday and then Thursday morning had sharp pain lateral hip down his leg.  He states left lower back pain, throbbing pain, worse with lying down and trying to pick up left leg in bed. He states he has left hip to groin pain with left testicular pain, occ will feel like a crawling pain. He also complained of left foot numbness, left knee weakness and left lateral hip numbness, states his foot and knee are doing better.  Pain is 24/7, nothing makes it worse. He states prednisone helped some.    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  He is not on cholesterol medication and denies myalgias. His cholesterol is at goal. The cholesterol last visit was:   Lab Results  Component Value Date   CHOL 197 11/02/2017   HDL 71 11/02/2017   LDLCALC 110 (H) 11/02/2017   TRIG 69 11/02/2017   CHOLHDL 2.8 11/02/2017   Patient is not on Vitamin D supplement.   Lab Results  Component Value Date   VD25OH 46 11/02/2017     Lab Results  Component Value Date   PSA 0.6 11/02/2017   PSA 0.8 10/31/2016   PSA 0.56 10/30/2015   BMI is Body mass index is 21.43 kg/m., he is working on diet and exercise. Wt Readings from Last 3 Encounters:   11/02/18 136 lb 12.8 oz (62.1 kg)  11/02/17 140 lb 12.8 oz (63.9 kg)  10/31/16 144 lb 12.8 oz (65.7 kg)    Names of Other Physician/Practitioners you currently use: 1. [facility] here for primary care 2. [facility], eye doctor, 3 years ago, wears glasses 3. none, dentist Patient Care Team: [name], MD as PCP - General (Internal Medicine)  Medication Review: No current outpatient medications on file prior to visit.   No current facility-administered medications on file prior to visit.     Current Problems (verified) Patient Active Problem List   Diagnosis Date Noted  . Encounter for Medicare annual wellness exam 10/30/2015  . Sinus bradycardia, chronic 10/30/2015  . Hypertension   . Hyperlipidemia   . GERD (gastroesophageal reflux disease)   . Vitamin D deficiency     Screening Tests Immunization History  Administered Date(s) Administered  . Pneumococcal Conjugate-13 11/02/2017  . Pneumococcal-Unspecified 11/21/2005  . Td 11/21/2010    Preventative care: Tetanus: 09/2011 Pneumovax:01/12/2006  Prevnar 2018 Flu vaccine: declines Zostavax: declines  DEXA: declines Colonoscopy: 2009 due 2019 does not want colonoscopy will discuss cologuard again next year EGD: 2009 Ct chest 09/2008 CxR 10/2013 hyperinflation  Allergies No Known Allergies  SURGICAL HISTORY He  has a past surgical history that includes Appendectomy; Hernia repair (Right, 2010); and Tonsillectomy and adenoidectomy. FAMILY HISTORY His family history includes Cancer in his sister; Diabetes in his mother; Hypertension in his father; Stroke in his mother. SOCIAL HISTORY He  reports that he quit smoking about 27 years ago. He has never used smokeless tobacco. He reports that he does not drink alcohol or use drugs.  MEDICARE WELLNESS OBJECTIVES: Physical activity: Current Exercise Habits: The patient does not participate in regular exercise at present(stays  active) Cardiac risk factors: Cardiac Risk Factors include: advanced age (>75men, >61 women);male gender;hypertension;sedentary lifestyle Depression/mood screen:   Depression screen [facility] 2/9 11/02/2018  Decreased Interest 0  Down, Depressed, Hopeless 0  PHQ - 2 Score 0    ADLs:  In your present state of health, do you have any difficulty performing the following activities: 11/02/2018 11/02/2017  Hearing? Y N  Vision? N N  Difficulty concentrating or making decisions? N N  Walking or climbing stairs? N N  Dressing or bathing? N N  Doing errands, shopping? N N  Some recent data might be hidden     Cognitive Testing  Alert? Yes  Normal Appearance?Yes  Oriented to person? Yes  Place? Yes   Time? Yes  Recall of three objects?  Yes  Can perform simple calculations? Yes  Displays appropriate judgment?Yes  Can read the correct time from a watch face?Yes  EOL planning: Does Patient Have  a Medical Advance Directive?: No Would patient like information on creating a medical advance directive?: No - Patient declined Discussed with patient, has discussed with his daughter that is [phone] an a Engineer, civil (consulting)nurse, states she knows his wishes.    Review of Systems  Constitutional: Negative.   HENT: Negative.   Eyes: Negative.   Respiratory: Negative.   Cardiovascular: Negative.   Gastrointestinal: Negative.   Genitourinary: Negative.  Negative for dysuria, flank pain, frequency, hematuria and urgency.  Musculoskeletal: Positive for back pain.  Skin: Negative.   Neurological: Negative.   Endo/Heme/Allergies: Negative.   Psychiatric/Behavioral: Negative.       Objective:     Blood pressure 112/60, pulse 82, temperature 97.8 F (36.6 C), height 5\' 7"  (1.702 m), weight 136 lb 12.8 oz (62.1 kg), SpO2 96 %. Body mass index is 21.43 kg/m.  General appearance: alert, no distress, WD/WN, male HEENT: normocephalic, sclerae anicteric, TMs pearly, nares patent, no discharge or erythema, pharynx normal,  decreased hearing bilateral Oral cavity: MMM, no lesions Neck: supple, no lymphadenopathy, no thyromegaly, no masses Heart: RRR, normal S1, S2, no murmurs Lungs: CTA bilaterally, no wheezes, rhonchi, or rales Abdomen: +bs, soft, non tender, non distended, no masses, no hepatomegaly, no splenomegaly, + left inguinal hernia, easily retractable. Musculoskeletal: left tender SI, negative straight leg, good distal reflexes, strength and pulses, no swelling, no obvious deformity Extremities: no edema, no cyanosis, no clubbing Pulses: 2+ symmetric, upper and lower extremities, normal cap refill Neurological: alert, oriented x 3, CN2-12 intact, strength normal upper extremities and lower extremities, sensation normal throughout, DTRs 2+ throughout, no cerebellar signs, gait normal, Skin: several erythematous, scaly plaques on arms Psychiatric: normal affect, behavior normal, pleasant     Medicare Attestation I have personally reviewed: The patient's medical and social history Their use of alcohol, tobacco or illicit drugs Their current medications and supplements The patient's functional ability including ADLs,fall risks, home safety risks, cognitive, and hearing and visual impairment Diet and physical activities Evidence for depression or mood disorders  The patient's weight, height, BMI, and visual acuity have been recorded in the chart.  I have made referrals, counseling, and provided education to the patient based on review of the above and I have provided the patient with a written personalized care plan for preventive services.     Quentin Mullingmanda Whitni Pasquini, PA-C   11/02/2018

## 2018-11-02 NOTE — Patient Instructions (Addendum)
[employer] with no obligation # [phone] Do not have to be a member Tues-Sat 10-6  [place]- free test with no obligation # [phone] MUST BE A MEMBER Call for store hours  Have had patient's get good cheaper hearing aids from [facility] The air version has good reviews.    Carlos Green , Thank you for taking time to come for your Medicare Wellness Visit. I appreciate your ongoing commitment to your health goals. Please review the following plan we discussed and let me know if I can assist you in the future.    This is a list of the screening recommended for you and due dates:  Health Maintenance  Topic Date Due  . Pneumonia vaccines (2 of 2 - PPSV23) 11/02/2018  . Tetanus Vaccine  11/21/2020  . Flu Shot  Discontinued    If you have been in the ER, hospital, or long term care facility, we want to see you within one week of discharge so please CONTACT OUR OFFICE at [phone].  We will try to contact you if we know about your admission/visit but we would love for you to contact us first.  We know your history and want to make sure all of your questions are answered and needs are taken care of after admission.   We also want our patients to know that we do NOT approve of [facility] soliciting our patients to do home visits and we do NOT approve of [facility] screenings. Please contact us before doing either of these "services".     Inguinal Hernia, Adult An inguinal hernia is when fat or the intestines push through the area where the leg meets the lower abdomen (groin) and create a rounded lump (bulge). This condition develops over time. There are three types of inguinal hernias. These types include:  Hernias that can be pushed back into the belly (are reducible).  Hernias that are not reducible (are incarcerated).  Hernias that are not reducible and lose their blood supply (are strangulated). This type of hernia requires emergency  surgery.  What are the causes? This condition is caused by having a weak spot in the muscles or tissue. This weakness lets the hernia poke through. This condition can be triggered by:  Suddenly straining the muscles of the lower abdomen.  Lifting heavy objects.  Straining to have a bowel movement. Difficult bowel movements (constipation) can lead to this.  Coughing.  What increases the risk? This condition is more likely to develop in:  Men.  Pregnant women.  People who: ? Are overweight. ? Work in jobs that require long periods of standing or heavy lifting. ? Have had an inguinal hernia before. ? Smoke or have lung disease. These factors can lead to long-lasting (chronic) coughing.  What are the signs or symptoms? Symptoms can depend on the size of the hernia. Often, a small inguinal hernia has no symptoms. Symptoms of a larger hernia include:  A lump in the groin. This is easier to see when the person is standing. It might not be visible when he or she is lying down.  Pain or burning in the groin. This occurs especially when lifting, straining, or coughing.  A dull ache or a feeling of pressure in the groin.  A lump in the scrotum in men.  Symptoms of a strangulated inguinal hernia can include:  A bulge in the groin that is very painful and tender to the touch.  A bulge that  turns red or purple.  Fever, nausea, and vomiting.  The inability to have a bowel movement or to pass gas.  How is this diagnosed? This condition is diagnosed with a medical history and physical exam. Your health care provider may feel your groin area and ask you to cough. How is this treated? Treatment for this condition varies depending on the size of your hernia and whether you have symptoms. If you do not have symptoms, your health care provider may have you watch your hernia carefully and come in for follow-up visits. If your hernia is larger or if you have symptoms, your treatment will  include surgery. Follow these instructions at home: Lifestyle  Drink enough fluid to keep your urine clear or pale yellow.  Eat a diet that includes a lot of fiber. Eat plenty of fruits, vegetables, and whole grains. Talk with your health care provider if you have questions.  Avoid lifting heavy objects.  Avoid standing for long periods of time.  Do not use tobacco products, including cigarettes, chewing tobacco, or e-cigarettes. If you need help quitting, ask your health care provider.  Maintain a healthy weight. General instructions  Do not try to force the hernia back in.  Watch your hernia for any changes in color or size. Let your health care provider know if any changes occur.  Take over-the-counter and prescription medicines only as told by your health care provider.  Keep all follow-up visits as told by your health care provider. This is important. Contact a health care provider if:  You have a fever.  You have new symptoms.  Your symptoms get worse. Get help right away if:  You have pain in the groin that suddenly gets worse.  A bulge in the groin gets bigger suddenly and does not go down.  You are a man and you have a sudden pain in the scrotum, or the size of your scrotum suddenly changes.  A bulge in the groin area becomes red or purple and is painful to the touch.  You have nausea or vomiting that does not go away.  You feel your heart beating a lot more quickly than normal.  You cannot have a bowel movement or pass gas. This information is not intended to replace advice given to you by your health care provider. Make sure you discuss any questions you have with your health care provider. Document Released: 03/26/2009 Document Revised: 04/14/2016 Document Reviewed: 09/17/2014 Elsevier Interactive Patient Education  2018 [place] INFORMATION   Cologuard is an easy to use noninvasive colon cancer screening test based on the latest  advances in stool DNA science.   Colon cancer is 3rd most diagnosed cancer and 2nd leading cause of death in both men and women 5 years of age and older despite being one of the most preventable and treatable cancers if found early.  4 of out 5 people diagnosed with colon cancer have NO prior family history.  When caught EARLY 90% of colon cancer is curable.   More than 92% of cologuard patients have NO out of pocket cost for screening however only your insurer can confirm how Cologuard would be covered for you. [employer] has a team of specialist that can help you contact your insurer and ask the right questions. Please call [phone] so they can help.   You will receive a short call from [place] support center at [employer], when you receive a call they will say they are from  [employer],  to confirm your mailing address and give you more information.  When they calll you, it will appear on the caller ID as "[employer]" or in some cases only this number will appear, [phone].   [employer] will ship your collection kit directly to you. You will collect a single stool sample in the privacy of your own home, no special preparation required. You will return the kit via [facility] pre-paid shipping or pick-up, in the same box it arrived in. Then I will contact you to discuss your results after I receive them from the laboratory.   If you have any questions or concerns, [employer] Customer Support Specialist are available 24 hours a day, 7 days a week at [phone] or go to [email or web address].

## 2018-11-03 LAB — IRON, TOTAL/TOTAL IRON BINDING CAP
%SAT: 32 % (ref 20–48)
Iron: 102 ug/dL (ref 50–180)
TIBC: 320 ug/dL (ref 250–425)

## 2018-11-03 LAB — CBC WITH DIFFERENTIAL/PLATELET
BASOS PCT: 1.4 %
Basophils Absolute: 67 cells/uL (ref 0–200)
Eosinophils Absolute: 149 cells/uL (ref 15–500)
Eosinophils Relative: 3.1 %
HEMATOCRIT: 47.1 % (ref 38.5–50.0)
HEMOGLOBIN: 15.6 g/dL (ref 13.2–17.1)
LYMPHS ABS: 1099 {cells}/uL (ref 850–3900)
MCH: 29.1 pg (ref 27.0–33.0)
MCHC: 33.1 g/dL (ref 32.0–36.0)
MCV: 87.7 fL (ref 80.0–100.0)
MPV: 10.9 fL (ref 7.5–12.5)
Monocytes Relative: 11.2 %
NEUTROS PCT: 61.4 %
Neutro Abs: 2947 cells/uL (ref 1500–7800)
Platelets: 213 10*3/uL (ref 140–400)
RBC: 5.37 10*6/uL (ref 4.20–5.80)
RDW: 12 % (ref 11.0–15.0)
Total Lymphocyte: 22.9 %
WBC: 4.8 10*3/uL (ref 3.8–10.8)
WBCMIX: 538 {cells}/uL (ref 200–950)

## 2018-11-03 LAB — URINALYSIS, ROUTINE W REFLEX MICROSCOPIC
Bilirubin Urine: NEGATIVE
Glucose, UA: NEGATIVE
HGB URINE DIPSTICK: NEGATIVE
KETONES UR: NEGATIVE
Leukocytes, UA: NEGATIVE
NITRITE: NEGATIVE
PH: 7 (ref 5.0–8.0)
Protein, ur: NEGATIVE
Specific Gravity, Urine: 1.008 (ref 1.001–1.03)

## 2018-11-03 LAB — MICROALBUMIN / CREATININE URINE RATIO: Creatinine, Urine: 28 mg/dL (ref 20–320)

## 2018-11-03 LAB — COMPLETE METABOLIC PANEL WITH GFR
AG RATIO: 1.7 (calc) (ref 1.0–2.5)
ALBUMIN MSPROF: 4.3 g/dL (ref 3.6–5.1)
ALT: 12 U/L (ref 9–46)
AST: 20 U/L (ref 10–35)
Alkaline phosphatase (APISO): 65 U/L (ref 40–115)
BUN: 17 mg/dL (ref 7–25)
CALCIUM: 9.6 mg/dL (ref 8.6–10.3)
CO2: 30 mmol/L (ref 20–32)
Chloride: 101 mmol/L (ref 98–110)
Creat: 0.95 mg/dL (ref 0.70–1.18)
GFR, EST AFRICAN AMERICAN: 89 mL/min/{1.73_m2} (ref >=60)
GFR, EST NON AFRICAN AMERICAN: 77 mL/min/{1.73_m2} (ref >=60)
GLOBULIN: 2.5 g/dL (ref 1.9–3.7)
Glucose, Bld: 80 mg/dL (ref 65–99)
POTASSIUM: 4.4 mmol/L (ref 3.5–5.3)
Sodium: 139 mmol/L (ref 135–146)
TOTAL PROTEIN: 6.8 g/dL (ref 6.1–8.1)
Total Bilirubin: 0.6 mg/dL (ref 0.2–1.2)

## 2018-11-03 LAB — MAGNESIUM: Magnesium: 1.8 mg/dL (ref 1.5–2.5)

## 2018-11-03 LAB — LIPID PANEL
CHOL/HDL RATIO: 2.9 (calc) (ref <5.0)
Cholesterol: 208 mg/dL — ABNORMAL HIGH (ref <200)
HDL: 71 mg/dL (ref >40)
LDL CHOLESTEROL (CALC): 120 mg/dL — AB
NON-HDL CHOLESTEROL (CALC): 137 mg/dL — AB (ref <130)
Triglycerides: 75 mg/dL (ref <150)

## 2018-11-03 LAB — TSH: TSH: 1.64 mIU/L (ref 0.40–4.50)

## 2018-11-03 LAB — VITAMIN D 25 HYDROXY (VIT D DEFICIENCY, FRACTURES): VIT D 25 HYDROXY: 38 ng/mL (ref 30–100)

## 2018-11-03 LAB — VITAMIN B12: Vitamin B-12: 823 pg/mL (ref 200–1100)

## 2018-11-07 ENCOUNTER — Encounter: Payer: Self-pay | Admitting: Physician Assistant

## 2019-04-04 ENCOUNTER — Encounter: Payer: Self-pay | Admitting: Internal Medicine

## 2019-05-06 ENCOUNTER — Encounter: Payer: Self-pay | Admitting: Internal Medicine

## 2019-07-25 DIAGNOSIS — R69 Illness, unspecified: Secondary | ICD-10-CM | POA: Diagnosis not present

## 2019-09-04 DIAGNOSIS — R69 Illness, unspecified: Secondary | ICD-10-CM | POA: Diagnosis not present

## 2019-11-06 ENCOUNTER — Telehealth: Payer: Self-pay

## 2019-11-06 ENCOUNTER — Ambulatory Visit: Payer: Self-pay | Admitting: Physician Assistant

## 2019-11-06 NOTE — Telephone Encounter (Signed)
PATIENT has declined to complete the COLOGUARD test. Please cancel the order------Dec 16th 2020 at 10:23am

## 2019-11-06 NOTE — Progress Notes (Signed)
CPE  Assessment:   Screen for colon cancer -   declined  Essential hypertension - continue medications, DASH diet, exercise and monitor at home. Call if greater than 130/80.  -     CBC with Differential/Platelet -     BASIC METABOLIC PANEL WITH GFR -     Hepatic function panel -     TSH -     Urinalysis, Routine w reflex microscopic -     Microalbumin / creatinine urine ratio -     EKG 12-Lead  Sinus bradycardia, chronic Asymptomatic, will continue to monitor.   Gastroesophageal reflux disease without esophagitis Continue PPI/H2 blocker, diet discussed  Hyperlipidemia, unspecified hyperlipidemia type -continue medications, check lipids, decrease fatty foods, increase activity.  -     Lipid panel  Vitamin D deficiency -     VITAMIN D 25 Hydroxy (Vit-D Deficiency, Fractures)  Medication management -     Magnesium  BPH without obstruction/lower urinary tract symptoms -     Due to age, will not check PSA    Future Appointments  Date Time Provider Department Center  11/10/2020  2:00 PM Quentin Mulling, PA-C [id number] None    Subjective:  Carlos Green is a 78 y.o. male who presents CPE and follow up for cholesterol, vitamin D def.    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  He denies chest pain, shortness of breath, dizziness.  He is not on cholesterol medication and denies myalgias. His cholesterol is at goal. The cholesterol last visit was:   Lab Results  Component Value Date   CHOL 208 (H) 11/02/2018   HDL 71 11/02/2018   LDLCALC 120 (H) 11/02/2018   TRIG 75 11/02/2018   CHOLHDL 2.9 11/02/2018   Patient is not on Vitamin D supplement. He is on magnesium and multivitamin.  Lab Results  Component Value Date   VD25OH 38 11/02/2018     Lab Results  Component Value Date   PSA 0.6 11/02/2017   PSA 0.8 10/31/2016   PSA 0.56 10/30/2015    BMI is Body mass index is 20.89 kg/m., he is working on diet and exercise. Wt Readings from Last 3 Encounters:  11/07/19 133 lb 6.4 oz (60.5 kg)  11/02/18 136 lb 12.8 oz (62.1 kg)  11/02/17 140 lb 12.8 oz (63.9 kg)   Has left inguinal hernia, states he is not having any issues, will monitor.   Names of Other Physician/Practitioners you currently use: 1. [facility] here for primary care 2. [facility], eye doctor, 3 years ago, wears glasses 3. none, dentist Patient Care Team: [name], MD as PCP - General (Internal Medicine)  Medication Review: No current outpatient medications on file prior to visit.   No current facility-administered medications on file prior to visit.    Current Problems (verified) Patient Active Problem List   Diagnosis Date Noted  . Encounter for Medicare annual wellness exam 10/30/2015  . Sinus bradycardia, chronic 10/30/2015  . Hypertension   . Hyperlipidemia   . GERD (gastroesophageal reflux disease)   . Vitamin D deficiency     Screening Tests Immunization History  Administered Date(s) Administered  . Influenza, High Dose Seasonal PF 09/04/2019  . Pneumococcal Conjugate-13 11/02/2017  . Pneumococcal-Unspecified 11/21/2005  . Td 11/21/2010    Preventative care: Tetanus: 09/2011 Pneumovax:01/12/2006  Prevnar 2018 Flu vaccine: 2020 Zostavax: declines  DEXA: declines Colonoscopy: 2009 due 2019 does not want colonoscopy, declines cologuard EGD: 2009 Ct chest 09/2008 CxR 10/2013 hyperinflation  Allergies No Known Allergies  SURGICAL HISTORY He  has a past surgical history that includes Appendectomy; Hernia repair (Right, 2010); and Tonsillectomy and adenoidectomy. FAMILY HISTORY His family history includes Cancer in his sister; Diabetes in his mother; Hypertension in his father; Stroke in his mother. SOCIAL HISTORY He  reports that he quit smoking about 28 years ago. He has never used  smokeless tobacco. He reports that he does not drink alcohol or use drugs.   Review of Systems  Constitutional: Negative.   HENT: Negative.   Eyes: Negative.   Respiratory: Negative.   Cardiovascular: Negative.   Gastrointestinal: Negative.   Genitourinary: Negative.  Negative for dysuria, flank pain, frequency, hematuria and urgency.  Musculoskeletal: Positive for back pain.  Skin: Negative.   Neurological: Negative.   Endo/Heme/Allergies: Negative.   Psychiatric/Behavioral: Negative.       Objective:     Blood pressure 124/68, pulse 66, temperature (!) 97.5 F (36.4 C), height 5\' 7"  (1.702 m), weight 133 lb 6.4 oz (60.5 kg), SpO2 99 %. Body mass index is 20.89 kg/m.  General appearance: alert, no distress, WD/WN, male HEENT: normocephalic, sclerae anicteric, TMs pearly, nares patent, no discharge or erythema, pharynx normal, decreased hearing bilateral Oral cavity: MMM, no lesions Neck: supple, no lymphadenopathy, no thyromegaly, no masses Heart: RRR, normal S1, S2, no murmurs Lungs: CTA bilaterally, no wheezes, rhonchi, or rales Abdomen: +bs, soft, non tender, non distended, no masses, no hepatomegaly, no splenomegaly, + left inguinal hernia, easily retractable. Musculoskeletal: non tender, good distal reflexes, strength and pulses, no swelling, no obvious deformity Extremities: no edema, no cyanosis, no clubbing Pulses: 2+ symmetric, upper and lower extremities, normal cap refill Neurological: alert, oriented x 3, CN2-12 intact, strength normal upper extremities and lower extremities, sensation normal throughout, DTRs 2+ throughout, no cerebellar signs, gait normal, Skin: several erythematous, scaly plaques on arms Psychiatric: normal affect, behavior normal, pleasant       Vicie Mutters, PA-C   11/07/2019

## 2019-11-07 ENCOUNTER — Encounter: Payer: Self-pay | Admitting: Physician Assistant

## 2019-11-07 ENCOUNTER — Ambulatory Visit (INDEPENDENT_AMBULATORY_CARE_PROVIDER_SITE_OTHER): Payer: Medicare HMO | Admitting: Physician Assistant

## 2019-11-07 ENCOUNTER — Other Ambulatory Visit: Payer: Self-pay

## 2019-11-07 VITALS — BP 124/68 | HR 66 | Temp 97.5°F | Ht 67.0 in | Wt 133.4 lb

## 2019-11-07 DIAGNOSIS — Z79899 Other long term (current) drug therapy: Secondary | ICD-10-CM

## 2019-11-07 DIAGNOSIS — E559 Vitamin D deficiency, unspecified: Secondary | ICD-10-CM

## 2019-11-07 DIAGNOSIS — Z0000 Encounter for general adult medical examination without abnormal findings: Secondary | ICD-10-CM | POA: Diagnosis not present

## 2019-11-07 DIAGNOSIS — Z0001 Encounter for general adult medical examination with abnormal findings: Secondary | ICD-10-CM

## 2019-11-07 DIAGNOSIS — Z136 Encounter for screening for cardiovascular disorders: Secondary | ICD-10-CM | POA: Diagnosis not present

## 2019-11-07 DIAGNOSIS — E785 Hyperlipidemia, unspecified: Secondary | ICD-10-CM | POA: Diagnosis not present

## 2019-11-07 DIAGNOSIS — Z1211 Encounter for screening for malignant neoplasm of colon: Secondary | ICD-10-CM

## 2019-11-07 DIAGNOSIS — R001 Bradycardia, unspecified: Secondary | ICD-10-CM

## 2019-11-07 DIAGNOSIS — I10 Essential (primary) hypertension: Secondary | ICD-10-CM | POA: Diagnosis not present

## 2019-11-07 DIAGNOSIS — K219 Gastro-esophageal reflux disease without esophagitis: Secondary | ICD-10-CM

## 2019-11-07 NOTE — Patient Instructions (Signed)
GENERAL HEALTH GOALS  Know what a healthy weight is for you (roughly BMI <25) and aim to maintain this  Aim for 7+ servings of fruits and vegetables daily  70-80+ fluid ounces of water or unsweet tea for healthy kidneys  Limit to max 1 drink of alcohol per day; avoid smoking/tobacco  Limit animal fats in diet for cholesterol and heart health - choose grass fed whenever available  Avoid highly processed foods, and foods high in saturated/trans fats  Aim for low stress - take time to unwind and care for your mental health  Aim for 150 min of moderate intensity exercise weekly for heart health, and weights twice weekly for bone health  Aim for 7-9 hours of sleep daily   Inguinal Hernia, Adult An inguinal hernia is when fat or your intestines push through a weak spot in a muscle where your leg meets your lower belly (groin). This causes a rounded lump (bulge). This kind of hernia could also be:  In your scrotum, if you are male.  In folds of skin around your vagina, if you are male. There are three types of inguinal hernias. These include:  Hernias that can be pushed back into the belly (are reducible). This type rarely causes pain.  Hernias that cannot be pushed back into the belly (are incarcerated).  Hernias that cannot be pushed back into the belly and lose their blood supply (are strangulated). This type needs emergency surgery. If you do not have symptoms, you may not need treatment. If you have symptoms or a large hernia, you may need surgery. Follow these instructions at home: Lifestyle  Do these things if told by your doctor so you do not have trouble pooping (constipation): ? Drink enough fluid to keep your pee (urine) pale yellow. ? Eat foods that have a lot of fiber. These include fresh fruits and vegetables, whole grains, and beans. ? Limit foods that are high in fat and processed sugars. These include foods that are fried or sweet. ? Take medicine for trouble  pooping.  Avoid lifting heavy objects.  Avoid standing for long amounts of time.  Do not use any products that contain nicotine or tobacco. These include cigarettes and e-cigarettes. If you need help quitting, ask your doctor.  Stay at a healthy weight. General instructions  You may try to push your hernia in by very gently pressing on it when you are lying down. Do not try to force the bulge back in if it will not push in easily.  Watch your hernia for any changes in shape, size, or color. Tell your doctor if you see any changes.  Take over-the-counter and prescription medicines only as told by your doctor.  Keep all follow-up visits as told by your doctor. This is important. Contact a doctor if:  You have a fever.  You have new symptoms.  Your symptoms get worse. Get help right away if:  The area where your leg meets your lower belly has: ? Pain that gets worse suddenly. ? A bulge that gets bigger suddenly, and it does not get smaller after that. ? A bulge that turns red or purple. ? A bulge that is painful when you touch it.  You are a man, and your scrotum: ? Suddenly feels painful. ? Suddenly changes in size.  You cannot push the hernia in by very gently pressing on it when you are lying down. Do not try to force the bulge back in if it will not push  in easily.  You feel sick to your stomach (nauseous), and that feeling does not go away.  You throw up (vomit), and that keeps happening.  You have a fast heartbeat.  You cannot poop (have a bowel movement) or pass gas. These symptoms may be an emergency. Do not wait to see if the symptoms will go away. Get medical help right away. Call your local emergency services (911 in the [place] Summary  An inguinal hernia is when fat or your intestines push through a weak spot in a muscle where your leg meets your lower belly (groin). This causes a rounded lump (bulge).  If you do not have symptoms, you may not need  treatment. If you have symptoms or a large hernia, you may need surgery.  Avoid lifting heavy objects. Also avoid standing for long amounts of time.  Do not try to force the bulge back in if it will not push in easily. This information is not intended to replace advice given to you by your health care provider. Make sure you discuss any questions you have with your health care provider. Document Released: 12/08/2006 Document Revised: 12/09/2017 Document Reviewed: 08/09/2017 Elsevier Patient Education  2020 [employer].

## 2019-11-08 LAB — CBC WITH DIFFERENTIAL/PLATELET
Absolute Monocytes: 374 cells/uL (ref 200–950)
Basophils Absolute: 72 cells/uL (ref 0–200)
Basophils Relative: 1.5 %
Eosinophils Absolute: 221 cells/uL (ref 15–500)
Eosinophils Relative: 4.6 %
HCT: 44 % (ref 38.5–50.0)
Hemoglobin: 14.7 g/dL (ref 13.2–17.1)
Lymphs Abs: 1090 cells/uL (ref 850–3900)
MCH: 30.1 pg (ref 27.0–33.0)
MCHC: 33.4 g/dL (ref 32.0–36.0)
MCV: 90 fL (ref 80.0–100.0)
MPV: 11.1 fL (ref 7.5–12.5)
Monocytes Relative: 7.8 %
Neutro Abs: 3043 cells/uL (ref 1500–7800)
Neutrophils Relative %: 63.4 %
Platelets: 184 10*3/uL (ref 140–400)
RBC: 4.89 10*6/uL (ref 4.20–5.80)
RDW: 11.8 % (ref 11.0–15.0)
Total Lymphocyte: 22.7 %
WBC: 4.8 10*3/uL (ref 3.8–10.8)

## 2019-11-08 LAB — LIPID PANEL
Cholesterol: 199 mg/dL (ref <200)
HDL: 62 mg/dL (ref >=40)
LDL Cholesterol (Calc): 112 mg/dL (calc) — ABNORMAL HIGH
Non-HDL Cholesterol (Calc): 137 mg/dL (calc) — ABNORMAL HIGH (ref <130)
Total CHOL/HDL Ratio: 3.2 (calc) (ref <5.0)
Triglycerides: 131 mg/dL (ref <150)

## 2019-11-08 LAB — COMPLETE METABOLIC PANEL WITH GFR
AG Ratio: 1.8 (calc) (ref 1.0–2.5)
ALT: 13 U/L (ref 9–46)
AST: 19 U/L (ref 10–35)
Albumin: 4.1 g/dL (ref 3.6–5.1)
Alkaline phosphatase (APISO): 68 U/L (ref 35–144)
BUN: 16 mg/dL (ref 7–25)
CO2: 29 mmol/L (ref 20–32)
Calcium: 9.5 mg/dL (ref 8.6–10.3)
Chloride: 101 mmol/L (ref 98–110)
Creat: 0.94 mg/dL (ref 0.70–1.18)
GFR, Est African American: 90 mL/min/{1.73_m2} (ref >=60)
GFR, Est Non African American: 77 mL/min/{1.73_m2} (ref >=60)
Globulin: 2.3 g/dL (calc) (ref 1.9–3.7)
Glucose, Bld: 82 mg/dL (ref 65–99)
Potassium: 4.1 mmol/L (ref 3.5–5.3)
Sodium: 138 mmol/L (ref 135–146)
Total Bilirubin: 0.4 mg/dL (ref 0.2–1.2)
Total Protein: 6.4 g/dL (ref 6.1–8.1)

## 2019-11-08 LAB — URINALYSIS, ROUTINE W REFLEX MICROSCOPIC
Bilirubin Urine: NEGATIVE
Glucose, UA: NEGATIVE
Hgb urine dipstick: NEGATIVE
Ketones, ur: NEGATIVE
Leukocytes,Ua: NEGATIVE
Nitrite: NEGATIVE
Protein, ur: NEGATIVE
Specific Gravity, Urine: 1.008 (ref 1.001–1.03)
pH: 6 (ref 5.0–8.0)

## 2019-11-08 LAB — MAGNESIUM: Magnesium: 2 mg/dL (ref 1.5–2.5)

## 2019-11-08 LAB — VITAMIN D 25 HYDROXY (VIT D DEFICIENCY, FRACTURES): Vit D, 25-Hydroxy: 59 ng/mL (ref 30–100)

## 2019-11-08 LAB — TSH: TSH: 3.6 mIU/L (ref 0.40–4.50)

## 2019-11-08 LAB — MICROALBUMIN / CREATININE URINE RATIO
Creatinine, Urine: 38 mg/dL (ref 20–320)
Microalb, Ur: <0.2 mg/dL

## 2020-03-31 ENCOUNTER — Encounter (INDEPENDENT_AMBULATORY_CARE_PROVIDER_SITE_OTHER): Payer: Self-pay | Admitting: Otolaryngology

## 2020-03-31 ENCOUNTER — Ambulatory Visit (INDEPENDENT_AMBULATORY_CARE_PROVIDER_SITE_OTHER): Payer: Medicare Other | Admitting: Otolaryngology

## 2020-03-31 ENCOUNTER — Other Ambulatory Visit: Payer: Self-pay

## 2020-03-31 VITALS — Temp 98.1°F

## 2020-03-31 DIAGNOSIS — H8093 Unspecified otosclerosis, bilateral: Secondary | ICD-10-CM | POA: Diagnosis not present

## 2020-03-31 DIAGNOSIS — H900 Conductive hearing loss, bilateral: Secondary | ICD-10-CM | POA: Diagnosis not present

## 2020-03-31 NOTE — Progress Notes (Signed)
HPI: Carlos Green is a 79 y.o. male who presents for evaluation of hearing loss for hearing aid clearance.  Past Medical History:  Diagnosis Date  . GERD (gastroesophageal reflux disease)    esophagitis EGD 2009  . Hyperlipidemia   . Hypertension    Labile  . Vitamin D deficiency    Past Surgical History:  Procedure Laterality Date  . APPENDECTOMY    . HERNIA REPAIR Right 2010   inguinal  . TONSILLECTOMY AND ADENOIDECTOMY     Social History   Socioeconomic History  . Marital status: Married    Spouse name: Not on file  . Number of children: Not on file  . Years of education: Not on file  . Highest education level: Not on file  Occupational History  . Not on file  Tobacco Use  . Smoking status: Former Smoker    Packs/day: 1.50    Years: 30.00    Pack years: 45.00    Start date: 1962    Quit date: 10/22/1991    Years since quitting: 28.4  . Smokeless tobacco: Never Used  Substance and Sexual Activity  . Alcohol use: No  . Drug use: No  . Sexual activity: Not Currently  Other Topics Concern  . Not on file  Social History Narrative  . Not on file   Social Determinants of Health   Financial Resource Strain:   . Difficulty of Paying Living Expenses:   Food Insecurity:   . Worried About Charity fundraiser in the Last Year:   . Arboriculturist in the Last Year:   Transportation Needs:   . Film/video editor (Medical):   Marland Kitchen Lack of Transportation (Non-Medical):   Physical Activity:   . Days of Exercise per Week:   . Minutes of Exercise per Session:   Stress:   . Feeling of Stress :   Social Connections:   . Frequency of Communication with Friends and Family:   . Frequency of Social Gatherings with Friends and Family:   . Attends Religious Services:   . Active Member of Clubs or Organizations:   . Attends Archivist Meetings:   Marland Kitchen Marital Status:    Family History  Problem Relation Age of Onset  . Diabetes Mother   . Stroke Mother   .  Hypertension Father   . Cancer Sister        breast   No Known Allergies Prior to Admission medications   Not on File     Positive ROS: Otherwise negative  All other systems have been reviewed and were otherwise negative with the exception of those mentioned in the HPI and as above.  Physical Exam: Constitutional: Alert, well-appearing, no acute distress Ears: External ears without lesions or tenderness. Ear canals with minimal wax buildup that is nonobstructing.  TMs were clear bilaterally.  On tuning fork testing with the 512 tuning fork BC was greater than AC bilaterally and Weber lateralized to the left. Nasal: External nose without lesions. Clear nasal passages Oral: Lips and gums without lesions. Tongue and palate mucosa without lesions. Posterior oropharynx clear. Neck: No palpable adenopathy or masses Respiratory: Breathing comfortably  Skin: No facial/neck lesions or rash noted.  Audiogram was faxed to [place] from his audiologist.  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  He is cleared to use hearing aids bilaterally. If he wanted to investigate surgery referred him to Dr. Suszanne Conners for evaluation.  I gave him clearance for hearing aids.  Narda Bonds, MD

## 2020-05-29 ENCOUNTER — Encounter (INDEPENDENT_AMBULATORY_CARE_PROVIDER_SITE_OTHER): Payer: Self-pay

## 2020-11-10 ENCOUNTER — Other Ambulatory Visit: Payer: Self-pay

## 2020-11-10 ENCOUNTER — Ambulatory Visit (INDEPENDENT_AMBULATORY_CARE_PROVIDER_SITE_OTHER): Payer: Medicare Other | Admitting: Adult Health Nurse Practitioner

## 2020-11-10 ENCOUNTER — Encounter: Payer: Self-pay | Admitting: Adult Health Nurse Practitioner

## 2020-11-10 VITALS — BP 118/62 | Temp 97.6°F | Ht 63.0 in | Wt 135.0 lb

## 2020-11-10 DIAGNOSIS — Z0000 Encounter for general adult medical examination without abnormal findings: Secondary | ICD-10-CM

## 2020-11-10 DIAGNOSIS — K219 Gastro-esophageal reflux disease without esophagitis: Secondary | ICD-10-CM | POA: Diagnosis not present

## 2020-11-10 DIAGNOSIS — R6889 Other general symptoms and signs: Secondary | ICD-10-CM

## 2020-11-10 DIAGNOSIS — N401 Enlarged prostate with lower urinary tract symptoms: Secondary | ICD-10-CM | POA: Diagnosis not present

## 2020-11-10 DIAGNOSIS — I10 Essential (primary) hypertension: Secondary | ICD-10-CM

## 2020-11-10 DIAGNOSIS — N138 Other obstructive and reflux uropathy: Secondary | ICD-10-CM

## 2020-11-10 DIAGNOSIS — Z0001 Encounter for general adult medical examination with abnormal findings: Secondary | ICD-10-CM

## 2020-11-10 DIAGNOSIS — Z79899 Other long term (current) drug therapy: Secondary | ICD-10-CM

## 2020-11-10 DIAGNOSIS — E785 Hyperlipidemia, unspecified: Secondary | ICD-10-CM

## 2020-11-10 DIAGNOSIS — R001 Bradycardia, unspecified: Secondary | ICD-10-CM

## 2020-11-10 DIAGNOSIS — E559 Vitamin D deficiency, unspecified: Secondary | ICD-10-CM

## 2020-11-10 NOTE — Progress Notes (Signed)
MEDICARE ANNUAL WELLNESS  Assessment:   Encounter for Medicare Annual Wellness  Yearly  Essential hypertension Continue current medications: Monitor blood pressure at home; call if consistently over 130/80 Continue DASH diet.   Reminder to go to the ER if any CP, SOB, nausea, dizziness, severe HA, changes vision/speech, left arm numbness and tingling and jaw pain. -     CBC with Differential/Platelet -     BASIC METABOLIC PANEL WITH GFR -     Hepatic function panel -     TSH -     Urinalysis, Routine w reflex microscopic -     Microalbumin / creatinine urine ratio -     EKG 12-Lead  Sinus bradycardia, chronic Asymptomatic, will continue to monitor.   Gastroesophageal reflux disease without esophagitis Continue PPI/H2 blocker, diet discussed  Hyperlipidemia, unspecified hyperlipidemia type Continue medications: Discussed dietary and exercise modifications Low fat diet   Vitamin D deficiency Continue supplementation to maintain goal of 70-100 Taking Vitamin D 2,000 IU daily Defer vitamin D level  BPH w/ obstruction/lower urinary tract symptoms -     Due to age, will not check PSA   Medication management -     Magnesium  Defer all labs today, just had last week.   Further disposition pending results if labs check today. Discussed med's effects and SE's.   Over 30 minutes of face to face interview, exam, counseling, chart review, and critical decision making was performed.    Future Appointments  Date Time Provider Department Center  11/10/2021  2:00 PM Elder Negus, NP [facility] None    Subjective:  Carlos KOCAK is a 79 y.o. male who presents for Medicare Annual Wellnss and follow up for cholesterol, vitamin D def.   Patient is very hard of hearing.  He reports he has had an evaluation for his hearing through the [place],  He has also completed an ENT evaluation and waiting for final approval for the hearing aids.  He does not take any prescription  medications, only supplements.  He is also seen at the [facility] for his medical care.   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  He denies chest pain, shortness of breath, dizziness.  He is not on cholesterol medication and denies myalgias. His cholesterol is at goal. The cholesterol last visit was:   Lab Results  Component Value Date   CHOL 199 11/07/2019   HDL 62 11/07/2019   LDLCALC 112 (H) 11/07/2019   TRIG 131 11/07/2019   CHOLHDL 3.2 11/07/2019   Patient is not on Vitamin D supplement. He is on magnesium and multivitamin.  Lab Results  Component Value Date   VD25OH 59 11/07/2019     Lab Results  Component Value Date   PSA 0.6 11/02/2017   PSA 0.8 10/31/2016   PSA 0.56 10/30/2015   BMI is Body mass index is 23.91 kg/m., he is working on diet and exercise. Wt Readings from Last 3 Encounters:  11/10/20 135 lb (61.2 kg)  11/07/19 133 lb 6.4 oz (60.5 kg)  11/02/18 136 lb 12.8 oz (62.1 kg)   Has left inguinal hernia, states he is not having any issues, will monitor.   Names of Other Physician/Practitioners you currently use: 1. [facility] here for primary care 2. [employer], eye doctor, 3 years ago, wears glasses 3. none, dentist very 11month Patient Care  Team: [name], MD as PCP - General (Internal Medicine)  Medication Review: No current outpatient medications on file prior to visit.   No current facility-administered medications on file prior to visit.    Current Problems (verified) Patient Active Problem List   Diagnosis Date Noted  . Encounter for Medicare annual wellness exam 10/30/2015  . Sinus bradycardia, chronic 10/30/2015  . Hypertension   . Hyperlipidemia   . GERD (gastroesophageal reflux disease)   . Vitamin D deficiency     Screening Tests Immunization History  Administered Date(s)  Administered  . Influenza, High Dose Seasonal PF 09/04/2019, 09/07/2020  . PFIZER SARS-COV-2 Vaccination 01/17/2020, 02/14/2020  . Pneumococcal Conjugate-13 11/02/2017  . Pneumococcal-Unspecified 11/21/2005  . Td 11/21/2010    Preventative care: Tetanus: 09/2011 Pneumovax:01/12/2006  Prevnar 2018 Flu vaccine: 10/21 Zostavax: declines  DEXA: declines Colonoscopy: 2009 due 2019 does not want colonoscopy, declines cologuard EGD: 2009 Ct chest 09/2008 CxR 10/2013 hyperinflation SARS-COV2-Moderna 01/17/20 & 02/14/20 Complete  Allergies No Known Allergies  SURGICAL HISTORY He  has a past surgical history that includes Appendectomy; Hernia repair (Right, 2010); and Tonsillectomy and adenoidectomy. FAMILY HISTORY His family history includes Cancer in his sister; Diabetes in his mother; Hypertension in his father; Stroke in his mother. SOCIAL HISTORY He  reports that he quit smoking about 29 years ago. He started smoking about 60 years ago. He has a 45.00 pack-year smoking history. He has never used smokeless tobacco. He reports that he does not drink alcohol and does not use drugs.  MEDICARE WELLNESS OBJECTIVES: Physical activity: Current Exercise Habits: Home exercise routine (Walks 2 miles every day), Time (Minutes): 30, Intensity: Mild, Exercise limited by: None identified Cardiac risk factors: Cardiac Risk Factors include: advanced age (>65men, >42 women);hypertension;male gender;sedentary lifestyle Depression/mood screen:   Depression screen [facility] 2/9 11/10/2020  Decreased Interest 0  Down, Depressed, Hopeless 0  PHQ - 2 Score 0    ADLs:  In your present state of health, do you have any difficulty performing the following activities: 11/10/2020  Hearing? Y  Comment Has hearing test scheduled through [place]  Vision? N  Difficulty concentrating or making decisions? N  Walking or climbing stairs? N  Dressing or bathing? N  Doing errands, shopping? N  Preparing Food and eating ? N   Using the Toilet? N  In the past six months, have you accidently leaked urine? N  Do you have problems with loss of bowel control? N  Managing your Medications? N  Managing your Finances? N  Housekeeping or managing your Housekeeping? N  Some recent data might be hidden     Cognitive Testing  Alert? Yes  Normal Appearance?Yes  Oriented to person? Yes  Place? Yes   Time? Yes  Recall of three objects?  Yes  Can perform simple calculations? Yes  Displays appropriate judgment?Yes  Can read the correct time from a watch face?Yes  EOL planning: Does Patient Have a Medical Advance Directive?: No Would patient like information on creating a medical advance directive?: No - Patient declined    Review of Systems  Constitutional: Negative.   HENT: Negative.   Eyes: Negative.   Respiratory: Negative.   Cardiovascular: Negative.   Gastrointestinal: Negative.   Genitourinary: Negative.  Negative for dysuria, flank pain, frequency, hematuria and urgency.  Musculoskeletal: Positive for back pain.  Skin: Negative.   Neurological: Negative.   Endo/Heme/Allergies: Negative.   Psychiatric/Behavioral: Negative.       Objective:     Blood pressure 118/62, temperature 97.6  F (36.4 C), height 5\' 3"  (1.6 m), weight 135 lb (61.2 kg). Body mass index is 23.91 kg/m.  General appearance: alert, no distress, WD/WN, male HEENT: normocephalic, sclerae anicteric, TMs pearly, nares patent, no discharge or erythema, pharynx normal, decreased hearing bilateral Oral cavity: MMM, no lesions Neck: supple, no lymphadenopathy, no thyromegaly, no masses Heart: RRR, normal S1, S2, no murmurs Lungs: CTA bilaterally, no wheezes, rhonchi, or rales Abdomen: +bs, soft, non tender, non distended, no masses, no hepatomegaly, no splenomegaly, + left inguinal hernia, easily retractable. Musculoskeletal: non tender, good distal reflexes, strength and pulses, no swelling, no obvious deformity Extremities: no  edema, no cyanosis, no clubbing Pulses: 2+ symmetric, upper and lower extremities, normal cap refill Neurological: alert, oriented x 3, CN2-12 intact, strength normal upper extremities and lower extremities, sensation normal throughout, DTRs 2+ throughout, no cerebellar signs, gait normal, Skin: several erythematous, scaly plaques on arms Psychiatric: normal affect, behavior normal, pleasant       , Elder Negus, DNP [facility] 11/10/2020  2:52 PM

## 2021-11-08 DIAGNOSIS — N401 Enlarged prostate with lower urinary tract symptoms: Secondary | ICD-10-CM | POA: Insufficient documentation

## 2021-11-08 DIAGNOSIS — N138 Other obstructive and reflux uropathy: Secondary | ICD-10-CM | POA: Insufficient documentation

## 2021-11-08 NOTE — Progress Notes (Signed)
MEDICARE ANNUAL WELLNESS  Assessment:   Encounter for Medicare Annual Wellness  Yearly  Essential hypertension Monitor blood pressure at home; call if consistently over 130/80 Continue DASH diet.   Reminder to go to the ER if any CP, SOB, nausea, dizziness, severe HA, changes vision/speech, left arm numbness and tingling and jaw pain. -     CBC with Differential/Platelet -     cmp -     TSH  Sinus bradycardia, chronic Asymptomatic, will continue to monitor.   Gastroesophageal reflux disease without esophagitis Continue PPI/H2 blocker, diet discussed  Hyperlipidemia, unspecified hyperlipidemia type Continue medications: Discussed dietary and exercise modifications Low fat diet Lipid panel TSH  Vitamin D deficiency Continue supplementation to maintain goal of 70-100 Taking Vitamin D 2,000 IU daily  Vitamin D level  BPH w/ obstruction/lower urinary tract symptoms -     Due to age, will not check PSA   Medication management -     Magnesium     Further disposition pending results if labs check today. Discussed med's effects and SE's.   Over 30 minutes of face to face interview, exam, counseling, chart review, and critical decision making was performed.    Future Appointments  Date Time Provider [place]  11/10/2022  2:00 PM Magda Bernheim, NP [facility] None    Subjective:  Carlos Green is a 80 y.o. male who presents for Medicare Annual [place] and follow up for cholesterol, vitamin D def.   He has bilateral hearing aids for hearing loss. Doing well.  He does not take any prescription medications, only supplements.  He is also seen at the [facility] for his medical care.   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  He denies chest pain, shortness of  breath, dizziness.  He is not on cholesterol medication and denies myalgias. His cholesterol is at goal. The cholesterol last visit was:   Lab Results  Component Value Date   CHOL 199 11/07/2019   HDL 62 11/07/2019   LDLCALC 112 (H) 11/07/2019   TRIG 131 11/07/2019   CHOLHDL 3.2 11/07/2019   Patient is not on Vitamin D supplement. He is on magnesium and multivitamin.  Lab Results  Component Value Date   VD25OH 59 11/07/2019     Lab Results  Component Value Date   PSA 0.6 11/02/2017   PSA 0.8 10/31/2016   PSA 0.56 10/30/2015   BMI is Body mass index is 23.7 kg/m., he is working on diet and exercise. Wt Readings from Last 3 Encounters:  11/10/21 133 lb 12.8 oz (60.7 kg)  11/10/20 135 lb (61.2 kg)  11/07/19 133 lb 6.4 oz (60.5 kg)   Has left inguinal hernia, states he is not having any issues, will monitor.   Names of Other Physician/Practitioners you currently use: 1. [facility] here for primary care 2. [facility] 20[facility] 3. [employer], dentist 2022 very 31month Patient Care Team: Unk Pinto, MD as PCP - General (Internal Medicine)  Medication Review: Current Outpatient Medications on File Prior to Visit  Medication Sig   Ascorbic Acid (VITAMIN C) 1000 MG tablet Take 500 mg by mouth daily.   Cholecalciferol (VITAMIN D3) 50 MCG (2000 UT) capsule Take 2,000 Units by mouth daily.   cyanocobalamin 1000  MCG tablet Take 1,000 mcg by mouth daily.   Multiple Vitamin (MULTIVITAMIN) tablet Take 1 tablet by mouth daily.   No current facility-administered medications on file prior to visit.    Current Problems (verified) Patient Active Problem List   Diagnosis Date Noted   BPH with obstruction/lower urinary tract symptoms 11/08/2021   Encounter for Medicare annual wellness exam 10/30/2015   Sinus bradycardia, chronic 10/30/2015   Essential hypertension    Hyperlipidemia    GERD (gastroesophageal reflux disease)    Vitamin D deficiency      Screening Tests Immunization History  Administered Date(s) Administered   Influenza, High Dose Seasonal PF 09/04/2019, 09/07/2020   Influenza-Unspecified 10/22/2021   PFIZER(Purple Top)SARS-COV-2 Vaccination 01/17/2020, 02/14/2020   Pneumococcal Conjugate-13 11/02/2017   Pneumococcal-Unspecified 11/21/2005   Td 11/21/2010    Preventative care: Tetanus: 19/1/22 Pneumovax:01/12/2006  Prevnar 2018 Flu vaccine: 09/2021 Zostavax: declines  DEXA: declines Colonoscopy: 2009 due 2019 does not want colonoscopy, declines cologuard EGD: 2009 Ct chest 09/2008 CxR 10/2013 hyperinflation SARS-COV2-Moderna 01/17/20 & 02/14/20 Complete  Allergies No Known Allergies  SURGICAL HISTORY He  has a past surgical history that includes Appendectomy; Hernia repair (Right, 2010); and Tonsillectomy and adenoidectomy. FAMILY HISTORY His family history includes Cancer in his sister; Diabetes in his mother; Hypertension in his father; Stroke in his mother. SOCIAL HISTORY He  reports that he quit smoking about 30 years ago. His smoking use included cigarettes. He started smoking about 61 years ago. He has a 45.00 pack-year smoking history. He has never used smokeless tobacco. He reports that he does not drink alcohol and does not use drugs.  MEDICARE WELLNESS OBJECTIVES: Physical activity: Current Exercise Habits: Home exercise routine, Type of exercise: walking, Time (Minutes): 30, Frequency (Times/Week): 7, Weekly Exercise (Minutes/Week): 210, Intensity: Mild, Exercise limited by: None identified Cardiac risk factors: Cardiac Risk Factors include: advanced age (>60men, >36 women);hypertension;male gender;smoking/ tobacco exposure Depression/mood screen:   Depression screen [facility] 2/9 11/10/2021  Decreased Interest 0  Down, Depressed, Hopeless 0  PHQ - 2 Score 0    ADLs:  In your present state of health, do you have any difficulty performing the following activities: 11/10/2021 11/10/2020  Hearing? Y  Y  Comment - Has hearing test scheduled through [place]? N N  Difficulty concentrating or making decisions? N N  Walking or climbing stairs? N N  Dressing or bathing? N N  Doing errands, shopping? N N  Preparing Food and eating ? - N  Using the Toilet? - N  In the past six months, have you accidently leaked urine? - N  Do you have problems with loss of bowel control? - N  Managing your Medications? - N  Managing your Finances? - N  Housekeeping or managing your Housekeeping? - N  Some recent data might be hidden     Cognitive Testing  Alert? Yes  Normal Appearance?Yes  Oriented to person? Yes  Place? Yes   Time? Yes  Recall of three objects?  Yes  Can perform simple calculations? Yes  Displays appropriate judgment?Yes  Can read the correct time from a watch face?Yes  EOL planning: Does Patient Have a Medical Advance Directive?: No Would patient like information on creating a medical advance directive?: No - Patient declined    Review of Systems  Constitutional: Negative.  Negative for chills, fever and weight loss.  HENT: Negative.  Negative for congestion and hearing loss.   Eyes: Negative.  Negative for blurred vision and double vision.  Respiratory:  Negative.  Negative for cough and shortness of breath.   Cardiovascular: Negative.  Negative for chest pain, palpitations, orthopnea and leg swelling.  Gastrointestinal: Negative.  Negative for abdominal pain, constipation, diarrhea, heartburn, nausea and vomiting.  Genitourinary: Negative.  Negative for dysuria, flank pain, frequency, hematuria and urgency.       Gets up 1-2 times a night to urinate  Musculoskeletal:  Negative for back pain, falls, joint pain and myalgias.  Skin: Negative.  Negative for rash.  Neurological: Negative.  Negative for dizziness, tingling, tremors, loss of consciousness and headaches.  Endo/Heme/Allergies: Negative.   Psychiatric/Behavioral: Negative.  Negative for depression, memory loss and  suicidal ideas.      Objective:     Blood pressure 126/68, pulse 62, temperature 97.9 F (36.6 C), weight 133 lb 12.8 oz (60.7 kg), SpO2 97 %. Body mass index is 23.7 kg/m.  General appearance: alert, no distress, WD/WN, male HEENT: normocephalic, sclerae anicteric, TMs pearly, nares patent, no discharge or erythema, pharynx normal, decreased hearing bilateral Oral cavity: MMM, no lesions Neck: supple, no lymphadenopathy, no thyromegaly, no masses Heart: RRR, normal S1, S2, no murmurs Lungs: CTA bilaterally, no wheezes, rhonchi, or rales Abdomen: +bs, soft, non tender, non distended, no masses, no hepatomegaly, no splenomegaly, + left inguinal hernia, easily retractable. Musculoskeletal: non tender, good distal reflexes, strength and pulses, no swelling, no obvious deformity Extremities: no edema, no cyanosis, no clubbing Pulses: 2+ symmetric, upper and lower extremities, normal cap refill Neurological: alert, oriented x 3, CN2-12 intact, strength normal upper extremities and lower extremities, sensation normal throughout, DTRs 2+ throughout, no cerebellar signs, gait normal, Skin: several erythematous, scaly plaques on arms Psychiatric: normal affect, behavior normal, pleasant       [facility]  11/10/2021

## 2021-11-10 ENCOUNTER — Encounter: Payer: Self-pay | Admitting: Nurse Practitioner

## 2021-11-10 ENCOUNTER — Ambulatory Visit (INDEPENDENT_AMBULATORY_CARE_PROVIDER_SITE_OTHER): Payer: Medicare Other | Admitting: Nurse Practitioner

## 2021-11-10 ENCOUNTER — Other Ambulatory Visit: Payer: Self-pay

## 2021-11-10 VITALS — BP 126/68 | HR 62 | Temp 97.9°F | Wt 133.8 lb

## 2021-11-10 DIAGNOSIS — K219 Gastro-esophageal reflux disease without esophagitis: Secondary | ICD-10-CM | POA: Diagnosis not present

## 2021-11-10 DIAGNOSIS — E559 Vitamin D deficiency, unspecified: Secondary | ICD-10-CM | POA: Diagnosis not present

## 2021-11-10 DIAGNOSIS — Z0001 Encounter for general adult medical examination with abnormal findings: Secondary | ICD-10-CM

## 2021-11-10 DIAGNOSIS — N401 Enlarged prostate with lower urinary tract symptoms: Secondary | ICD-10-CM

## 2021-11-10 DIAGNOSIS — Z79899 Other long term (current) drug therapy: Secondary | ICD-10-CM | POA: Diagnosis not present

## 2021-11-10 DIAGNOSIS — N138 Other obstructive and reflux uropathy: Secondary | ICD-10-CM

## 2021-11-10 DIAGNOSIS — R001 Bradycardia, unspecified: Secondary | ICD-10-CM

## 2021-11-10 DIAGNOSIS — R6889 Other general symptoms and signs: Secondary | ICD-10-CM | POA: Diagnosis not present

## 2021-11-10 DIAGNOSIS — E785 Hyperlipidemia, unspecified: Secondary | ICD-10-CM

## 2021-11-10 DIAGNOSIS — I10 Essential (primary) hypertension: Secondary | ICD-10-CM

## 2021-11-10 DIAGNOSIS — Z0000 Encounter for general adult medical examination without abnormal findings: Secondary | ICD-10-CM

## 2021-11-11 LAB — TSH: TSH: 3.22 mIU/L (ref 0.40–4.50)

## 2021-11-11 LAB — COMPLETE METABOLIC PANEL WITH GFR
AG Ratio: 1.7 (calc) (ref 1.0–2.5)
ALT: 14 U/L (ref 9–46)
AST: 20 U/L (ref 10–35)
Albumin: 4 g/dL (ref 3.6–5.1)
Alkaline phosphatase (APISO): 64 U/L (ref 35–144)
BUN: 17 mg/dL (ref 7–25)
CO2: 31 mmol/L (ref 20–32)
Calcium: 9.2 mg/dL (ref 8.6–10.3)
Chloride: 102 mmol/L (ref 98–110)
Creat: 0.94 mg/dL (ref 0.70–1.22)
Globulin: 2.4 g/dL (calc) (ref 1.9–3.7)
Glucose, Bld: 120 mg/dL — ABNORMAL HIGH (ref 65–99)
Potassium: 4.2 mmol/L (ref 3.5–5.3)
Sodium: 141 mmol/L (ref 135–146)
Total Bilirubin: 0.4 mg/dL (ref 0.2–1.2)
Total Protein: 6.4 g/dL (ref 6.1–8.1)
eGFR: 82 mL/min/{1.73_m2} (ref >=60)

## 2021-11-11 LAB — LIPID PANEL
Cholesterol: 186 mg/dL (ref <200)
HDL: 64 mg/dL (ref >=40)
LDL Cholesterol (Calc): 108 mg/dL (calc) — ABNORMAL HIGH
Non-HDL Cholesterol (Calc): 122 mg/dL (calc) (ref <130)
Total CHOL/HDL Ratio: 2.9 (calc) (ref <5.0)
Triglycerides: 62 mg/dL (ref <150)

## 2021-11-11 LAB — CBC WITH DIFFERENTIAL/PLATELET
Absolute Monocytes: 374 cells/uL (ref 200–950)
Basophils Absolute: 50 cells/uL (ref 0–200)
Basophils Relative: 1.1 %
Eosinophils Absolute: 140 cells/uL (ref 15–500)
Eosinophils Relative: 3.1 %
HCT: 42.1 % (ref 38.5–50.0)
Hemoglobin: 13.9 g/dL (ref 13.2–17.1)
Lymphs Abs: 959 cells/uL (ref 850–3900)
MCH: 30.3 pg (ref 27.0–33.0)
MCHC: 33 g/dL (ref 32.0–36.0)
MCV: 91.7 fL (ref 80.0–100.0)
MPV: 11.5 fL (ref 7.5–12.5)
Monocytes Relative: 8.3 %
Neutro Abs: 2979 cells/uL (ref 1500–7800)
Neutrophils Relative %: 66.2 %
Platelets: 187 10*3/uL (ref 140–400)
RBC: 4.59 10*6/uL (ref 4.20–5.80)
RDW: 12.1 % (ref 11.0–15.0)
Total Lymphocyte: 21.3 %
WBC: 4.5 10*3/uL (ref 3.8–10.8)

## 2021-11-11 LAB — MAGNESIUM: Magnesium: 2.1 mg/dL (ref 1.5–2.5)

## 2021-11-11 LAB — VITAMIN D 25 HYDROXY (VIT D DEFICIENCY, FRACTURES): Vit D, 25-Hydroxy: 73 ng/mL (ref 30–100)

## 2022-03-28 ENCOUNTER — Ambulatory Visit (INDEPENDENT_AMBULATORY_CARE_PROVIDER_SITE_OTHER): Payer: Medicare Other | Admitting: Nurse Practitioner

## 2022-03-28 ENCOUNTER — Encounter: Payer: Self-pay | Admitting: Nurse Practitioner

## 2022-03-28 VITALS — BP 120/60 | HR 54 | Temp 97.9°F | Resp 16 | Wt 128.6 lb

## 2022-03-28 DIAGNOSIS — R42 Dizziness and giddiness: Secondary | ICD-10-CM

## 2022-03-28 DIAGNOSIS — R112 Nausea with vomiting, unspecified: Secondary | ICD-10-CM | POA: Diagnosis not present

## 2022-03-28 MED ORDER — MECLIZINE HCL 25 MG PO TABS: ORAL_TABLET | ORAL | 11 refills | Status: DC

## 2022-03-28 NOTE — Progress Notes (Signed)
Assessment and Plan: ? ?Carlos Green was seen today for an episodic visit. ? ?Diagnoses and all order for this visit: ? ?1. Dizziness ? ?- meclizine (ANTIVERT) 25 MG tablet; Take 1/2 to 1 tablet 2 to 3 x /day as needed for Dizziness / Vertigo  Dispense: 90 tablet; Refill: 11 ?- CBC with Differential/Platelet ?- COMPLETE METABOLIC PANEL WITH GFR ? ?2. Nausea and vomiting, unspecified vomiting type ?Continue to stay well hydrated with water, Gatorade. ?Discussed how coffee and tea are diuretics and can aide in dehydration. ? ?- meclizine (ANTIVERT) 25 MG tablet; Take 1/2 to 1 tablet 2 to 3 x /day as needed for Dizziness / Vertigo  Dispense: 90 tablet; Refill: 11 ?- CBC with Differential/Platelet ?- COMPLETE METABOLIC PANEL WITH GFR ? ?Discussed follow up with ENT if s/s fail to improve. ? ?Notify office for further evaluation and treatment, questions or concerns if s/s fail to improve. The risks and benefits of my recommendations, as well as other treatment options were discussed with the patient today. Questions were answered. ? ?Further disposition pending results of labs. Discussed med's effects and SE's.   ? ?Over 15 minutes of exam, counseling, chart review, and critical decision making was performed.  ? ?Future Appointments  ?Date Time Provider Department Center  ?11/10/2022  2:30 PM Mull, Loma Sousa, NP [facility] None  ? ? ?------------------------------------------------------------------------------------------------------------------ ? ? ?HPI ?BP 120/60 Comment: 110/58 standing, 122/60 laying  Pulse (!) 54   Temp 97.9 ?F (36.6 ?C)   Resp 16   Wt 128 lb 9.6 oz (58.3 kg)   SpO2 96%   BMI 22.78 kg/m?  ? ? Carlos Green is a 81 y.o. male who presents for evaluation of dizziness.  The dizziness has been present for 3 days. These symptoms have occurred occasionally in the past, but have not been persistent.  He denies hx of vertigo, seizures. The patient describes the symptoms as vertigo and  lightheadedness. Symptoms are exacerbated by rapid head movements, rolling over in bed, rising from supine position, rising from squatting or sitting position, and motion The patient also complains of tinnitus ?hearing loss. Has had x1 associated with vomiting.  Patient denies aural pressure ?otalgia ?otorrhea.   ? ?He has taken any medications for tmt.  He has been worked up in the past for  hearing loss.  He has hearing aides but does not wear. ?He does not take prescription medication.  He has not started any new medication or OTC supplements.  He tries to stay well hydrated by drinking tea and coffee.  He has not noticed any changes in BM including diarrhea.  Denies CP, heart palpitations, SOB, DOE, syncope, HA, difficulty speaking, moving.  Denies any recent URI, fall, trauma, injury to head, ears. ? ?Orthostatics negative. ? ?Past Medical History:  ?Diagnosis Date  ? GERD (gastroesophageal reflux disease)   ? esophagitis EGD 2009  ? Hyperlipidemia   ? Hypertension   ? Labile  ? Vitamin D deficiency   ?  ? ?No Known Allergies ? ?Current Outpatient Medications on File Prior to Visit  ?Medication Sig  ? Ascorbic Acid (VITAMIN C) 1000 MG tablet Take 500 mg by mouth daily.  ? Cholecalciferol (VITAMIN D3) 50 MCG (2000 UT) capsule Take 2,000 Units by mouth daily.  ? cyanocobalamin 1000 MCG tablet Take 1,000 mcg by mouth daily.  ? Multiple Vitamin (MULTIVITAMIN) tablet Take 1 tablet by mouth daily.  ? ?No current facility-administered medications on file prior to visit.  ? ? ?ROS:  all negative except what is noted in the HPI.  ? ?ROS  ? ?Physical Exam: ? ?BP 120/60 Comment: 110/58 standing, 122/60 laying  Pulse (!) 54   Temp 97.9 ?F (36.6 ?C)   Resp 16   Wt 128 lb 9.6 oz (58.3 kg)   SpO2 96%   BMI 22.78 kg/m?  ? ?General Appearance: NAD.  Awake, conversant and cooperative. ?Eyes: PERRLA, EOMs intact.  Sclera white.  Conjunctiva without erythema. ?Sinuses: No frontal/maxillary tenderness.  No nasal discharge.  Nares patent.  ?ENT/Mouth: Ext aud canals clear.  Bilateral TMs w/DOL and without erythema or bulging. Hearing intact.  Posterior pharynx without swelling or exudate.  Tonsils without swelling or erythema.  ?Neck: Supple.  No masses, nodules or thyromegaly. ?Respiratory: Effort is regular with non-labored breathing. Breath sounds are equal bilaterally without rales, rhonchi, wheezing or stridor.  ?Cardio: RRR with no MRGs. Brisk peripheral pulses without edema.  ?Abdomen: Active BS in all four quadrants.  Soft and non-tender without guarding, rebound tenderness, hernias or masses. ?Lymphatics: Non tender without lymphadenopathy.  ?Musculoskeletal: Full ROM, 5/5 strength, normal ambulation.  No clubbing or cyanosis. ?Skin: Appropriate color for ethnicity. Warm without rashes, lesions, ecchymosis, ulcers.  ?Neuro: CN II-XII grossly normal. Normal muscle tone without cerebellar symptoms and intact sensation.   ?Psych: AO X 3,  appropriate mood and affect, insight and judgment.  ?  ? ?Adela Glimpse, NP ?11:44 AM ?[facility] ? ?

## 2022-03-29 LAB — COMPLETE METABOLIC PANEL WITH GFR
AG Ratio: 1.5 (calc) (ref 1.0–2.5)
ALT: 12 U/L (ref 9–46)
AST: 17 U/L (ref 10–35)
Albumin: 3.7 g/dL (ref 3.6–5.1)
Alkaline phosphatase (APISO): 67 U/L (ref 35–144)
BUN: 14 mg/dL (ref 7–25)
CO2: 25 mmol/L (ref 20–32)
Calcium: 9 mg/dL (ref 8.6–10.3)
Chloride: 106 mmol/L (ref 98–110)
Creat: 0.87 mg/dL (ref 0.70–1.22)
Globulin: 2.4 g/dL (calc) (ref 1.9–3.7)
Glucose, Bld: 121 mg/dL — ABNORMAL HIGH (ref 65–99)
Potassium: 4.1 mmol/L (ref 3.5–5.3)
Sodium: 137 mmol/L (ref 135–146)
Total Bilirubin: 0.5 mg/dL (ref 0.2–1.2)
Total Protein: 6.1 g/dL (ref 6.1–8.1)
eGFR: 87 mL/min/{1.73_m2} (ref >=60)

## 2022-03-29 LAB — CBC WITH DIFFERENTIAL/PLATELET
Absolute Monocytes: 320 cells/uL (ref 200–950)
Basophils Absolute: 51 cells/uL (ref 0–200)
Basophils Relative: 1.3 %
Eosinophils Absolute: 109 cells/uL (ref 15–500)
Eosinophils Relative: 2.8 %
HCT: 41 % (ref 38.5–50.0)
Hemoglobin: 13.7 g/dL (ref 13.2–17.1)
Lymphs Abs: 807 cells/uL — ABNORMAL LOW (ref 850–3900)
MCH: 29.8 pg (ref 27.0–33.0)
MCHC: 33.4 g/dL (ref 32.0–36.0)
MCV: 89.3 fL (ref 80.0–100.0)
MPV: 11 fL (ref 7.5–12.5)
Monocytes Relative: 8.2 %
Neutro Abs: 2613 cells/uL (ref 1500–7800)
Neutrophils Relative %: 67 %
Platelets: 157 10*3/uL (ref 140–400)
RBC: 4.59 10*6/uL (ref 4.20–5.80)
RDW: 12.6 % (ref 11.0–15.0)
Total Lymphocyte: 20.7 %
WBC: 3.9 10*3/uL (ref 3.8–10.8)

## 2022-11-09 NOTE — Progress Notes (Unsigned)
MEDICARE ANNUAL WELLNESS  Assessment:   Encounter for Medicare Annual Wellness  Yearly  Essential hypertension Monitor blood pressure at home; call if consistently over 130/80 Continue DASH diet.   Reminder to go to the ER if any CP, SOB, nausea, dizziness, severe HA, changes vision/speech, left arm numbness and tingling and jaw pain. -     CBC with Differential/Platelet -     cmp -     TSH  Sinus bradycardia, chronic Asymptomatic, will continue to monitor.   Gastroesophageal reflux disease without esophagitis Continue PPI/H2 blocker, diet discussed  Hyperlipidemia, unspecified hyperlipidemia type Continue medications: Discussed dietary and exercise modifications Low fat diet Lipid panel TSH  Vitamin D deficiency Continue supplementation to maintain goal of 70-100 Taking Vitamin D 2,000 IU daily  Vitamin D level  BPH w/ obstruction/lower urinary tract symptoms -     Due to age, will not check PSA   Medication management -     Magnesium  Abnormal Glucose Continue diet and exercise - A1c   Further disposition pending results if labs check today. Discussed med's effects and SE's.   Over 30 minutes of face to face interview, exam, counseling, chart review, and critical decision making was performed.    Future Appointments  Date Time Provider Department Center  11/10/2022  2:30 PM Raynelle Dick, NP [facility] None    Subjective:  Carlos Green is a 81 y.o. male who presents for Medicare Annual Wellnss and follow up for cholesterol, vitamin D def.   He has bilateral hearing aids for hearing loss. Doing well.  He does not take any prescription medications, only supplements.  He is also seen at the [facility] for his medical care.   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  He denies chest pain, shortness of breath, dizziness.  He is not on cholesterol medication and denies myalgias. His cholesterol is at goal. The cholesterol last visit was:   Lab Results  Component Value Date   CHOL 186 11/10/2021   HDL 64 11/10/2021   LDLCALC 108 (H) 11/10/2021   TRIG 62 11/10/2021   CHOLHDL 2.9 11/10/2021   Patient is not on Vitamin D supplement. He is on magnesium and multivitamin.  Lab Results  Component Value Date   VD25OH 73 11/10/2021     Lab Results  Component Value Date   PSA 0.6 11/02/2017   PSA 0.8 10/31/2016   PSA 0.56 10/30/2015   BMI is There is no height or weight on file to calculate BMI., he is working on diet and exercise. Wt Readings from Last 3 Encounters:  03/28/22 128 lb 9.6 oz (58.3 kg)  11/10/21 133 lb 12.8 oz (60.7 kg)  11/10/20 135 lb (61.2 kg)   Has left inguinal hernia, states he is not having any issues, will monitor.   Names of Other Physician/Practitioners you currently use: 1. [facility] here for primary care 2. [facility] 3. [employer], dentist 2022 very 36month Patient Care Team: [name], MD as PCP - General (Internal Medicine)  Medication Review: Current Outpatient Medications on File Prior to Visit  Medication Sig   Ascorbic Acid (VITAMIN C) 1000 MG tablet Take 500 mg by mouth daily.   Cholecalciferol (VITAMIN D3) 50 MCG (2000  UT) capsule Take 2,000 Units by mouth daily.   cyanocobalamin 1000 MCG tablet Take 1,000 mcg by mouth daily.   meclizine (ANTIVERT) 25 MG tablet Take 1/2 to 1 tablet 2 to 3 x /day as needed for Dizziness / Vertigo   Multiple Vitamin (MULTIVITAMIN) tablet Take 1 tablet by mouth daily.   No current facility-administered medications on file prior to visit.    Current Problems (verified) Patient Active Problem List   Diagnosis Date Noted   BPH with obstruction/lower urinary tract symptoms 11/08/2021   Encounter for Medicare annual  wellness exam 10/30/2015   Sinus bradycardia, chronic 10/30/2015   Essential hypertension    Hyperlipidemia    GERD (gastroesophageal reflux disease)    Vitamin D deficiency     Screening Tests Immunization History  Administered Date(s) Administered   Influenza, High Dose Seasonal PF 09/04/2019, 09/07/2020   Influenza-Unspecified 10/22/2021   PFIZER(Purple Top)SARS-COV-2 Vaccination 01/17/2020, 02/14/2020   Pneumococcal Conjugate-13 11/02/2017   Pneumococcal-Unspecified 11/21/2005   Td 11/21/2010    Preventative care: Tetanus: 19/1/22 Pneumovax:01/12/2006  Prevnar 2018 Flu vaccine: 09/2021 Zostavax: declines  DEXA: declines Colonoscopy: 2009 due 2019 does not want colonoscopy, declines cologuard EGD: 2009 Ct chest 09/2008 CxR 10/2013 hyperinflation SARS-COV2-Moderna 01/17/20 & 02/14/20 Complete  Allergies No Known Allergies  SURGICAL HISTORY He  has a past surgical history that includes Appendectomy; Hernia repair (Right, 2010); and Tonsillectomy and adenoidectomy. FAMILY HISTORY His family history includes Cancer in his sister; Diabetes in his mother; Hypertension in his father; Stroke in his mother. SOCIAL HISTORY He  reports that he quit smoking about 31 years ago. His smoking use included cigarettes. He started smoking about 62 years ago. He has a 45.00 pack-year smoking history. He has never used smokeless tobacco. He reports that he does not drink alcohol and does not use drugs.  MEDICARE WELLNESS OBJECTIVES: Physical activity:   Cardiac risk factors:   Depression/mood screen:      11/10/2021    2:22 PM  Depression screen PHQ 2/9  Decreased Interest 0  Down, Depressed, Hopeless 0  PHQ - 2 Score 0    ADLs:     11/10/2021    2:23 PM  In your present state of health, do you have any difficulty performing the following activities:  Hearing? 1  Vision? 0  Difficulty concentrating or making decisions? 0  Walking or climbing stairs? 0  Dressing or bathing?  0  Doing errands, shopping? 0     Cognitive Testing  Alert? Yes  Normal Appearance?Yes  Oriented to person? Yes  Place? Yes   Time? Yes  Recall of three objects?  Yes  Can perform simple calculations? Yes  Displays appropriate judgment?Yes  Can read the correct time from a watch face?Yes  EOL planning:      Review of Systems  Constitutional: Negative.  Negative for chills, fever and weight loss.  HENT: Negative.  Negative for congestion and hearing loss.   Eyes: Negative.  Negative for blurred vision and double vision.  Respiratory: Negative.  Negative for cough and shortness of breath.   Cardiovascular: Negative.  Negative for chest pain, palpitations, orthopnea and leg swelling.  Gastrointestinal: Negative.  Negative for abdominal pain, constipation, diarrhea, heartburn, nausea and vomiting.  Genitourinary: Negative.  Negative for dysuria, flank pain, frequency, hematuria and urgency.       Gets up 1-2 times a night to urinate  Musculoskeletal:  Negative for back pain, falls, joint pain and myalgias.  Skin: Negative.  Negative for rash.  Neurological: Negative.  Negative for dizziness, tingling, tremors, loss of consciousness and headaches.  Endo/Heme/Allergies: Negative.   Psychiatric/Behavioral: Negative.  Negative for depression, memory loss and suicidal ideas.       Objective:     There were no vitals taken for this visit. There is no height or weight on file to calculate BMI.  General appearance: alert, no distress, WD/WN, male HEENT: normocephalic, sclerae anicteric, TMs pearly, nares patent, no discharge or erythema, pharynx normal, decreased hearing bilateral Oral cavity: MMM, no lesions Neck: supple, no lymphadenopathy, no thyromegaly, no masses Heart: RRR, normal S1, S2, no murmurs Lungs: CTA bilaterally, no wheezes, rhonchi, or rales Abdomen: +bs, soft, non tender, non distended, no masses, no hepatomegaly, no splenomegaly, + left inguinal hernia, easily  retractable. Musculoskeletal: non tender, good distal reflexes, strength and pulses, no swelling, no obvious deformity Extremities: no edema, no cyanosis, no clubbing Pulses: 2+ symmetric, upper and lower extremities, normal cap refill Neurological: alert, oriented x 3, CN2-12 intact, strength normal upper extremities and lower extremities, sensation normal throughout, DTRs 2+ throughout, no cerebellar signs, gait normal, Skin: several erythematous, scaly plaques on arms Psychiatric: normal affect, behavior normal, pleasant       [facility]  11/09/2022

## 2022-11-10 ENCOUNTER — Encounter: Payer: Self-pay | Admitting: Nurse Practitioner

## 2022-11-10 ENCOUNTER — Ambulatory Visit: Payer: Medicare Other | Admitting: Nurse Practitioner

## 2022-11-10 ENCOUNTER — Ambulatory Visit (INDEPENDENT_AMBULATORY_CARE_PROVIDER_SITE_OTHER): Payer: Medicare Other | Admitting: Nurse Practitioner

## 2022-11-10 VITALS — BP 122/68 | HR 80 | Temp 97.7°F | Ht 63.0 in | Wt 134.8 lb

## 2022-11-10 DIAGNOSIS — R7309 Other abnormal glucose: Secondary | ICD-10-CM | POA: Diagnosis not present

## 2022-11-10 DIAGNOSIS — I10 Essential (primary) hypertension: Secondary | ICD-10-CM

## 2022-11-10 DIAGNOSIS — E785 Hyperlipidemia, unspecified: Secondary | ICD-10-CM | POA: Diagnosis not present

## 2022-11-10 DIAGNOSIS — Z0000 Encounter for general adult medical examination without abnormal findings: Secondary | ICD-10-CM

## 2022-11-10 DIAGNOSIS — Z1389 Encounter for screening for other disorder: Secondary | ICD-10-CM

## 2022-11-10 DIAGNOSIS — Z1329 Encounter for screening for other suspected endocrine disorder: Secondary | ICD-10-CM

## 2022-11-10 DIAGNOSIS — Z0001 Encounter for general adult medical examination with abnormal findings: Secondary | ICD-10-CM | POA: Diagnosis not present

## 2022-11-10 DIAGNOSIS — R6889 Other general symptoms and signs: Secondary | ICD-10-CM | POA: Diagnosis not present

## 2022-11-10 DIAGNOSIS — K219 Gastro-esophageal reflux disease without esophagitis: Secondary | ICD-10-CM

## 2022-11-10 DIAGNOSIS — I700 Atherosclerosis of aorta: Secondary | ICD-10-CM | POA: Diagnosis not present

## 2022-11-10 DIAGNOSIS — Z79899 Other long term (current) drug therapy: Secondary | ICD-10-CM | POA: Diagnosis not present

## 2022-11-10 DIAGNOSIS — Z136 Encounter for screening for cardiovascular disorders: Secondary | ICD-10-CM | POA: Diagnosis not present

## 2022-11-10 DIAGNOSIS — N401 Enlarged prostate with lower urinary tract symptoms: Secondary | ICD-10-CM

## 2022-11-10 DIAGNOSIS — E559 Vitamin D deficiency, unspecified: Secondary | ICD-10-CM | POA: Diagnosis not present

## 2022-11-10 DIAGNOSIS — R001 Bradycardia, unspecified: Secondary | ICD-10-CM | POA: Diagnosis not present

## 2022-11-10 NOTE — Patient Instructions (Signed)

## 2022-11-11 LAB — URINALYSIS, ROUTINE W REFLEX MICROSCOPIC
Bilirubin Urine: NEGATIVE
Glucose, UA: NEGATIVE
Hgb urine dipstick: NEGATIVE
Ketones, ur: NEGATIVE
Leukocytes,Ua: NEGATIVE
Nitrite: NEGATIVE
Protein, ur: NEGATIVE
Specific Gravity, Urine: 1.014 (ref 1.001–1.035)
pH: 6.5 (ref 5.0–8.0)

## 2022-11-11 LAB — COMPLETE METABOLIC PANEL WITH GFR
AG Ratio: 1.7 (calc) (ref 1.0–2.5)
ALT: 11 U/L (ref 9–46)
AST: 21 U/L (ref 10–35)
Albumin: 4 g/dL (ref 3.6–5.1)
Alkaline phosphatase (APISO): 75 U/L (ref 35–144)
BUN: 18 mg/dL (ref 7–25)
CO2: 29 mmol/L (ref 20–32)
Calcium: 9.7 mg/dL (ref 8.6–10.3)
Chloride: 102 mmol/L (ref 98–110)
Creat: 1.11 mg/dL (ref 0.70–1.22)
Globulin: 2.4 g/dL (calc) (ref 1.9–3.7)
Glucose, Bld: 99 mg/dL (ref 65–99)
Potassium: 4.6 mmol/L (ref 3.5–5.3)
Sodium: 140 mmol/L (ref 135–146)
Total Bilirubin: 0.4 mg/dL (ref 0.2–1.2)
Total Protein: 6.4 g/dL (ref 6.1–8.1)
eGFR: 67 mL/min/{1.73_m2} (ref >=60)

## 2022-11-11 LAB — CBC WITH DIFFERENTIAL/PLATELET
Absolute Monocytes: 398 cells/uL (ref 200–950)
Basophils Absolute: 61 cells/uL (ref 0–200)
Basophils Relative: 1.2 %
Eosinophils Absolute: 148 cells/uL (ref 15–500)
Eosinophils Relative: 2.9 %
HCT: 42.2 % (ref 38.5–50.0)
Hemoglobin: 14.2 g/dL (ref 13.2–17.1)
Lymphs Abs: 923 cells/uL (ref 850–3900)
MCH: 29.8 pg (ref 27.0–33.0)
MCHC: 33.6 g/dL (ref 32.0–36.0)
MCV: 88.5 fL (ref 80.0–100.0)
MPV: 11.4 fL (ref 7.5–12.5)
Monocytes Relative: 7.8 %
Neutro Abs: 3570 cells/uL (ref 1500–7800)
Neutrophils Relative %: 70 %
Platelets: 185 10*3/uL (ref 140–400)
RBC: 4.77 10*6/uL (ref 4.20–5.80)
RDW: 12 % (ref 11.0–15.0)
Total Lymphocyte: 18.1 %
WBC: 5.1 10*3/uL (ref 3.8–10.8)

## 2022-11-11 LAB — VITAMIN D 25 HYDROXY (VIT D DEFICIENCY, FRACTURES): Vit D, 25-Hydroxy: 50 ng/mL (ref 30–100)

## 2022-11-11 LAB — MAGNESIUM: Magnesium: 2 mg/dL (ref 1.5–2.5)

## 2022-11-11 LAB — HEMOGLOBIN A1C
Hgb A1c MFr Bld: 5.8 % of total Hgb — ABNORMAL HIGH (ref <5.7)
Mean Plasma Glucose: 120 mg/dL
eAG (mmol/L): 6.6 mmol/L

## 2022-11-11 LAB — LIPID PANEL
Cholesterol: 190 mg/dL (ref <200)
HDL: 69 mg/dL (ref >=40)
LDL Cholesterol (Calc): 102 mg/dL (calc) — ABNORMAL HIGH
Non-HDL Cholesterol (Calc): 121 mg/dL (calc) (ref <130)
Total CHOL/HDL Ratio: 2.8 (calc) (ref <5.0)
Triglycerides: 101 mg/dL (ref <150)

## 2022-11-11 LAB — TSH: TSH: 3.19 mIU/L (ref 0.40–4.50)

## 2022-11-11 LAB — MICROALBUMIN / CREATININE URINE RATIO
Creatinine, Urine: 91 mg/dL (ref 20–320)
Microalb Creat Ratio: 8 mcg/mg creat (ref <30)
Microalb, Ur: 0.7 mg/dL

## 2022-11-22 ENCOUNTER — Ambulatory Visit: Payer: Medicare Other | Admitting: Nurse Practitioner

## 2023-03-28 ENCOUNTER — Encounter: Payer: Medicare Other | Admitting: Nurse Practitioner

## 2023-08-08 NOTE — Progress Notes (Unsigned)
CPE  Assessment:   Encounter for general Adult Medical Examination with Abnormal Findings Due Yearly  Essential hypertension - currently controlled without medications. Continue DASH diet, exercise and monitor at home. Call if greater than 130/80.  -     CBC with Differential/Platelet -     CMP -     TSH -     Urinalysis, Routine w reflex microscopic -     Microalbumin / creatinine urine ratio -     EKG 12-Lead  Sinus bradycardia, chronic Asymptomatic, will continue to monitor.   Gastroesophageal reflux disease without esophagitis Continue PPI/H2 blocker, diet discussed  Hyperlipidemia, unspecified hyperlipidemia type -continue medications, check lipids, decrease fatty foods, increase activity.  -     Lipid panel  Vitamin D deficiency -     VITAMIN D 25 Hydroxy (Vit-D Deficiency, Fractures)  Medication management -     Magnesium  BPH without obstruction/lower urinary tract symptoms -      PSA   CRBBB Recommended referral to cardiology or echocardiogram but pt has declined Will monitor for symptoms  Medication management -     CBC with Differential/Platelet -     COMPLETE METABOLIC PANEL WITH GFR -     Magnesium -     Lipid panel -     TSH -     Hemoglobin A1C w/out eAG -     VITAMIN D 25 Hydroxy (Vit-D Deficiency, Fractures) -     EKG 12-Lead -     Urinalysis, Routine w reflex microscopic -     Microalbumin / creatinine urine ratio -     PSA  Screening for hematuria or proteinuria -     Urinalysis, Routine w reflex microscopic -     Microalbumin / creatinine urine ratio  Screening for thyroid disorder -     TSH  Screening for ischemic heart disease  EKG     Future Appointments  Date Time Provider Department Center  11/13/2023  2:30 PM Raynelle Dick, NP [facility] None  08/08/2024 10:00 AM Raynelle Dick, NP [facility] None    Subjective:  Carlos Green is a 82 y.o. male who presents CPE and follow up for cholesterol, vitamin D def.     His blood pressure has been controlled at home, today their BP is BP: 134/70  BP Readings from Last 3 Encounters:  08/09/23 134/70  11/10/22 122/68  03/28/22 120/60  He does workout still out walking but mainly staying home.  He denies chest pain, shortness of breath, dizziness.   He is not on cholesterol medication and denies myalgias. His cholesterol is at goal. The cholesterol last visit was:   Lab Results  Component Value Date   CHOL 190 11/10/2022   HDL 69 11/10/2022   LDLCALC 102 (H) 11/10/2022   TRIG 101 11/10/2022   CHOLHDL 2.8 11/10/2022   Patient is not on Vitamin D supplement. He is on magnesium and multivitamin.  Lab Results  Component Value Date   VD25OH 50 11/10/2022     Lab Results  Component Value Date   PSA 0.6 11/02/2017   PSA 0.8 10/31/2016   PSA 0.56 10/30/2015   BMI is Body mass index is 21.43 kg/m., he is working on diet and exercise.He states he is eating well. He gets lots of exercise- walks 2-3 miles a day Wt Readings from Last 3 Encounters:  08/09/23 132 lb 12.8 oz (60.2 kg)  11/10/22 134 lb 12.8 oz (61.1 kg)  03/28/22 128 lb 9.6  oz (58.3 kg)   Has left inguinal hernia, states he is not having any issues, will monitor.   Names of Other Physician/Practitioners you currently use: 1. [facility] here for primary care 2. [employer], eye doctor, 3 years ago, wears glasses 3. none, dentist Patient Care Team: [name], MD as PCP - General (Internal Medicine)  Medication Review: Current Outpatient Medications on File Prior to Visit  Medication Sig   Ascorbic Acid (VITAMIN C) 1000 MG tablet Take 500 mg by mouth daily.   Cholecalciferol (VITAMIN D3) 50 MCG (2000 UT) capsule Take 2,000 Units by mouth daily.   cyanocobalamin 1000 MCG tablet Take 1,000 mcg by mouth daily.   MAGNESIUM PO Take by mouth.   Multiple Vitamin (MULTIVITAMIN) tablet Take 1 tablet by mouth daily.   meclizine (ANTIVERT) 25 MG tablet  Take 1/2 to 1 tablet 2 to 3 x /day as needed for Dizziness / Vertigo (Patient not taking: Reported on 11/10/2022)   No current facility-administered medications on file prior to visit.    Current Problems (verified) Patient Active Problem List   Diagnosis Date Noted   BPH with obstruction/lower urinary tract symptoms 11/08/2021   Encounter for Medicare annual wellness exam 10/30/2015   Sinus bradycardia, chronic 10/30/2015   Essential hypertension    Hyperlipidemia    GERD (gastroesophageal reflux disease)    Vitamin D deficiency     Screening Tests Immunization History  Administered Date(s) Administered   Influenza, High Dose Seasonal PF 09/04/2019, 09/07/2020, 09/07/2022   Influenza-Unspecified 10/22/2021   PFIZER(Purple Top)SARS-COV-2 Vaccination 01/17/2020, 02/14/2020   Pneumococcal Conjugate-13 11/02/2017   Pneumococcal-Unspecified 11/21/2005   Td 11/21/2010   Tdap 07/22/2021   Health Maintenance  Topic Date Due   Zoster Vaccines- Shingrix (1 of 2) Never done   Pneumonia Vaccine 70+ Years old (2 of 2 - PPSV23 or PCV20) 11/02/2018   COVID-19 Vaccine (3 - Pfizer risk series) 03/13/2020   Medicare Annual Wellness (AWV)  11/11/2023   DTaP/Tdap/Td (3 - Td or Tdap) 07/23/2031   HPV VACCINES  Aged Out   INFLUENZA VACCINE  Discontinued     Allergies No Known Allergies  SURGICAL HISTORY He  has a past surgical history that includes Appendectomy; Hernia repair (Right, 2010); and Tonsillectomy and adenoidectomy. FAMILY HISTORY His family history includes Cancer in his sister; Diabetes in his mother; Hypertension in his father; Stroke in his mother. SOCIAL HISTORY He  reports that he quit smoking about 31 years ago. His smoking use included cigarettes. He started smoking about 62 years ago. He has a 46.4 pack-year smoking history. He has never used smokeless tobacco. He reports that he does not drink alcohol and does not use drugs.   Review of Systems  Constitutional:   Negative for chills and fever.  HENT:  Negative for congestion, hearing loss, sinus pain, sore throat and tinnitus.   Eyes:  Negative for blurred vision and double vision.  Respiratory:  Negative for cough, hemoptysis, sputum production, shortness of breath and wheezing.   Cardiovascular:  Negative for chest pain, palpitations and leg swelling.  Gastrointestinal:  Negative for abdominal pain, constipation, diarrhea, heartburn, nausea and vomiting.  Genitourinary:  Negative for dysuria and urgency.  Musculoskeletal:  Negative for back pain, falls, joint pain, myalgias and neck pain.  Skin:  Negative for rash.  Neurological:  Negative for dizziness, tingling, tremors, weakness and headaches.  Endo/Heme/Allergies:  Does not bruise/bleed easily.  Psychiatric/Behavioral:  Negative for depression and suicidal ideas. The patient is  not nervous/anxious and does not have insomnia.       Objective:     Blood pressure 134/70, pulse 64, temperature (!) 97.5 F (36.4 C), height 5\' 6"  (1.676 m), weight 132 lb 12.8 oz (60.2 kg), SpO2 97%. Body mass index is 21.43 kg/m.  General appearance: alert, no distress, WD/WN, male HEENT: normocephalic, sclerae anicteric, TMs pearly, nares patent, no discharge or erythema, pharynx normal, decreased hearing bilateral Oral cavity: MMM, no lesions Neck: supple, no lymphadenopathy, no thyromegaly, no masses Heart: RRR, normal S1, S2, no murmurs Lungs: CTA bilaterally, no wheezes, rhonchi, or rales Abdomen: +bs, soft, non tender, non distended, no masses, no hepatomegaly, no splenomegaly, + left inguinal hernia, easily retractable. Musculoskeletal: non tender, good distal reflexes, strength and pulses, no swelling, no obvious deformity Extremities: no edema, no cyanosis, no clubbing Pulses: 2+ symmetric, upper and lower extremities, normal cap refill Neurological: alert, oriented x 3, CN2-12 intact, strength normal upper extremities and lower extremities,  sensation normal throughout, DTRs 2+ throughout, no cerebellar signs, gait normal, Skin: several erythematous, scaly plaques on arms Psychiatric: normal affect, behavior normal, pleasant  EKG: Sinus Bradycardia, CRBBB    Raynelle Dick, NP   08/09/2023

## 2023-08-09 ENCOUNTER — Encounter: Payer: Self-pay | Admitting: Nurse Practitioner

## 2023-08-09 ENCOUNTER — Ambulatory Visit (INDEPENDENT_AMBULATORY_CARE_PROVIDER_SITE_OTHER): Payer: Medicare Other | Admitting: Nurse Practitioner

## 2023-08-09 VITALS — BP 134/70 | HR 64 | Temp 97.5°F | Ht 66.0 in | Wt 132.8 lb

## 2023-08-09 DIAGNOSIS — E559 Vitamin D deficiency, unspecified: Secondary | ICD-10-CM | POA: Diagnosis not present

## 2023-08-09 DIAGNOSIS — Z0000 Encounter for general adult medical examination without abnormal findings: Secondary | ICD-10-CM

## 2023-08-09 DIAGNOSIS — Z0001 Encounter for general adult medical examination with abnormal findings: Secondary | ICD-10-CM

## 2023-08-09 DIAGNOSIS — Z1389 Encounter for screening for other disorder: Secondary | ICD-10-CM

## 2023-08-09 DIAGNOSIS — R001 Bradycardia, unspecified: Secondary | ICD-10-CM | POA: Diagnosis not present

## 2023-08-09 DIAGNOSIS — Z79899 Other long term (current) drug therapy: Secondary | ICD-10-CM

## 2023-08-09 DIAGNOSIS — N138 Other obstructive and reflux uropathy: Secondary | ICD-10-CM

## 2023-08-09 DIAGNOSIS — R7309 Other abnormal glucose: Secondary | ICD-10-CM | POA: Diagnosis not present

## 2023-08-09 DIAGNOSIS — K219 Gastro-esophageal reflux disease without esophagitis: Secondary | ICD-10-CM

## 2023-08-09 DIAGNOSIS — Z136 Encounter for screening for cardiovascular disorders: Secondary | ICD-10-CM | POA: Diagnosis not present

## 2023-08-09 DIAGNOSIS — Z1329 Encounter for screening for other suspected endocrine disorder: Secondary | ICD-10-CM

## 2023-08-09 DIAGNOSIS — E785 Hyperlipidemia, unspecified: Secondary | ICD-10-CM

## 2023-08-09 DIAGNOSIS — I10 Essential (primary) hypertension: Secondary | ICD-10-CM | POA: Diagnosis not present

## 2023-08-09 NOTE — Patient Instructions (Signed)

## 2023-08-10 LAB — URINALYSIS, ROUTINE W REFLEX MICROSCOPIC
Bilirubin Urine: NEGATIVE
Glucose, UA: NEGATIVE
Hgb urine dipstick: NEGATIVE
Ketones, ur: NEGATIVE
Leukocytes,Ua: NEGATIVE
Nitrite: NEGATIVE
Protein, ur: NEGATIVE
Specific Gravity, Urine: 1.003 (ref 1.001–1.035)
pH: 7 (ref 5.0–8.0)

## 2023-08-10 LAB — COMPLETE METABOLIC PANEL WITH GFR
AG Ratio: 1.6 (calc) (ref 1.0–2.5)
ALT: 11 U/L (ref 9–46)
AST: 17 U/L (ref 10–35)
Albumin: 4.2 g/dL (ref 3.6–5.1)
Alkaline phosphatase (APISO): 88 U/L (ref 35–144)
BUN: 14 mg/dL (ref 7–25)
CO2: 31 mmol/L (ref 20–32)
Calcium: 9.8 mg/dL (ref 8.6–10.3)
Chloride: 100 mmol/L (ref 98–110)
Creat: 1.02 mg/dL (ref 0.70–1.22)
Globulin: 2.6 g/dL (calc) (ref 1.9–3.7)
Glucose, Bld: 95 mg/dL (ref 65–99)
Potassium: 4.6 mmol/L (ref 3.5–5.3)
Sodium: 138 mmol/L (ref 135–146)
Total Bilirubin: 0.5 mg/dL (ref 0.2–1.2)
Total Protein: 6.8 g/dL (ref 6.1–8.1)
eGFR: 74 mL/min/{1.73_m2} (ref >=60)

## 2023-08-10 LAB — CBC WITH DIFFERENTIAL/PLATELET
Absolute Monocytes: 499 cells/uL (ref 200–950)
Basophils Absolute: 48 cells/uL (ref 0–200)
Basophils Relative: 1 %
Eosinophils Absolute: 149 cells/uL (ref 15–500)
Eosinophils Relative: 3.1 %
HCT: 48.2 % (ref 38.5–50.0)
Hemoglobin: 15.4 g/dL (ref 13.2–17.1)
Lymphs Abs: 1066 cells/uL (ref 850–3900)
MCH: 29.4 pg (ref 27.0–33.0)
MCHC: 32 g/dL (ref 32.0–36.0)
MCV: 92 fL (ref 80.0–100.0)
MPV: 11.2 fL (ref 7.5–12.5)
Monocytes Relative: 10.4 %
Neutro Abs: 3038 cells/uL (ref 1500–7800)
Neutrophils Relative %: 63.3 %
Platelets: 197 10*3/uL (ref 140–400)
RBC: 5.24 10*6/uL (ref 4.20–5.80)
RDW: 11.9 % (ref 11.0–15.0)
Total Lymphocyte: 22.2 %
WBC: 4.8 10*3/uL (ref 3.8–10.8)

## 2023-08-10 LAB — MAGNESIUM: Magnesium: 2.1 mg/dL (ref 1.5–2.5)

## 2023-08-10 LAB — PSA: PSA: 0.72 ng/mL (ref <=4.00)

## 2023-08-10 LAB — LIPID PANEL
Cholesterol: 196 mg/dL (ref <200)
HDL: 63 mg/dL (ref >=40)
LDL Cholesterol (Calc): 110 mg/dL (calc) — ABNORMAL HIGH
Non-HDL Cholesterol (Calc): 133 mg/dL (calc) — ABNORMAL HIGH (ref <130)
Total CHOL/HDL Ratio: 3.1 (calc) (ref <5.0)
Triglycerides: 121 mg/dL (ref <150)

## 2023-08-10 LAB — TSH: TSH: 2.83 mIU/L (ref 0.40–4.50)

## 2023-08-10 LAB — HEMOGLOBIN A1C W/OUT EAG: Hgb A1c MFr Bld: 5.8 % of total Hgb — ABNORMAL HIGH (ref <5.7)

## 2023-08-10 LAB — MICROALBUMIN / CREATININE URINE RATIO
Creatinine, Urine: 29 mg/dL (ref 20–320)
Microalb, Ur: <0.2 mg/dL

## 2023-08-10 LAB — VITAMIN D 25 HYDROXY (VIT D DEFICIENCY, FRACTURES): Vit D, 25-Hydroxy: 44 ng/mL (ref 30–100)

## 2023-11-09 NOTE — Progress Notes (Signed)
MEDICARE ANNUAL WELLNESS  Assessment:   Encounter for Medicare Annual Wellness  Yearly  Essential hypertension Controlled without medication. Monitor blood pressure at home; call if consistently over 130/80 Continue DASH diet.   Reminder to go to the ER if any CP, SOB, nausea, dizziness, severe HA, changes vision/speech, left arm numbness and tingling and jaw pain. -     CBC with Differential/Platelet -     CMP   Sinus bradycardia, chronic Asymptomatic, will continue to monitor.   Gastroesophageal reflux disease without esophagitis Continue PPI/H2 blocker, diet discussed  Hyperlipidemia, unspecified hyperlipidemia type Continue decreasing saturated fats and increased exercise Low fat diet Lipid panel   Vitamin D deficiency Continue supplementation to maintain goal of 70-100 Taking Vitamin D 2,000 IU daily  Vitamin D level  BPH w/ obstruction/lower urinary tract symptoms -     Due to age, will not check PSA    Abnormal Glucose Continue diet and exercise - CMP  Medication management -     CBC with Differential/Platelet -     COMPLETE METABOLIC PANEL WITH GFR -     Lipid panel -     Magnesium       Further disposition pending results if labs check today. Discussed med's effects and SE's.   Over 30 minutes of face to face interview, exam, counseling, chart review, and critical decision making was performed.    Future Appointments  Date Time Provider Department Center  08/08/2024 10:00 AM Raynelle Dick, NP [facility] None  11/13/2024  3:00 PM Raynelle Dick, NP [facility] None     Subjective:  Carlos Green is a 82 y.o. male who presents for Medicare Annual Wellnss and follow up for cholesterol, vitamin D def.   He has bilateral hearing aids for hearing loss. He does not wear them.  Followed by [facility].   He does not take any prescription medications, only supplements.  He is also seen at the [facility] for his medical care.   His blood pressure has been  controlled at home without medication, today their BP is BP: 128/68 BP Readings from Last 3 Encounters:  11/13/23 128/68  08/09/23 134/70  11/10/22 122/68   He does workout, still out walking but mainly staying home.  He denies chest pain, shortness of breath, dizziness.   BMI is Body mass index is 21.5 kg/m., he has been working on diet and exercise. Wt Readings from Last 3 Encounters:  11/13/23 133 lb 3.2 oz (60.4 kg)  08/09/23 132 lb 12.8 oz (60.2 kg)  11/10/22 134 lb 12.8 oz (61.1 kg)    He is not on cholesterol medication . His cholesterol is not at goal. The cholesterol last visit was:   Lab Results  Component Value Date   CHOL 196 08/09/2023   HDL 63 08/09/2023   LDLCALC 110 (H) 08/09/2023   TRIG 121 08/09/2023   CHOLHDL 3.1 08/09/2023   Patient is on Vitamin D supplement. He is on magnesium and multivitamin.  Lab Results  Component Value Date   VD25OH 44 08/09/2023     Lab Results  Component Value Date   PSA 0.72 08/09/2023   PSA 0.6 11/02/2017   PSA 0.8 10/31/2016    Mildly elevated A1c controlled with diet and exercise Lab Results  Component Value Date   HGBA1C 5.8 (H) 08/09/2023      Names of Other Physician/Practitioners you currently use: 1. [facility] here for primary care 2. [facility] 10/2022 3.  [employer], dentist 09/2023 Patient Care Team: [name], MD as PCP - General (Internal Medicine)  Medication Review: Current Outpatient Medications on File Prior to Visit  Medication Sig   Ascorbic Acid (VITAMIN C) 1000 MG tablet Take 500 mg by mouth daily.   Cholecalciferol (VITAMIN D3) 50 MCG (2000 UT) capsule Take 2,000 Units by mouth daily.   cyanocobalamin 1000 MCG tablet Take 1,000 mcg by mouth daily.   MAGNESIUM PO Take by mouth.   Multiple Vitamin (MULTIVITAMIN) tablet Take 1 tablet by mouth daily.   No current facility-administered medications on file prior to visit.    Current Problems  (verified) Patient Active Problem List   Diagnosis Date Noted   BPH with obstruction/lower urinary tract symptoms 11/08/2021   Encounter for Medicare annual wellness exam 10/30/2015   Sinus bradycardia, chronic 10/30/2015   Essential hypertension    Hyperlipidemia    GERD (gastroesophageal reflux disease)    Vitamin D deficiency     Screening Tests Immunization History  Administered Date(s) Administered   Fluad Trivalent(High Dose 65+) 10/24/2023   Influenza, High Dose Seasonal PF 09/04/2019, 09/07/2020, 09/07/2022   Influenza-Unspecified 10/22/2021   PFIZER(Purple Top)SARS-COV-2 Vaccination 01/17/2020, 02/14/2020   Pneumococcal Conjugate-13 11/02/2017   Pneumococcal-Unspecified 11/21/2005   Td 11/21/2010   Tdap 07/22/2021   Health Maintenance  Topic Date Due   COVID-19 Vaccine (3 - Pfizer risk series) 11/29/2023 (Originally 03/13/2020)   Zoster Vaccines- Shingrix (1 of 2) 02/11/2024 (Originally 09/21/1960)   Pneumonia Vaccine 47+ Years old (2 of 2 - PPSV23 or PCV20) 11/12/2024 (Originally 11/02/2018)   Medicare Annual Wellness (AWV)  11/12/2024   DTaP/Tdap/Td (3 - Td or Tdap) 07/23/2031   HPV VACCINES  Aged Out   INFLUENZA VACCINE  Discontinued     Allergies No Known Allergies  SURGICAL HISTORY He  has a past surgical history that includes Appendectomy; Hernia repair (Right, 2010); and Tonsillectomy and adenoidectomy. FAMILY HISTORY His family history includes Cancer in his sister; Diabetes in his mother; Hypertension in his father; Stroke in his mother. SOCIAL HISTORY He  reports that he quit smoking about 32 years ago. His smoking use included cigarettes. He started smoking about 63 years ago. He has a 46.4 pack-year smoking history. He has never used smokeless tobacco. He reports that he does not drink alcohol and does not use drugs.  MEDICARE WELLNESS OBJECTIVES: Physical activity:  Pt walks 1-2 miles everyday Cardiac risk factors: Cardiac Risk Factors include:  advanced age (>46men, >84 women);dyslipidemia;male gender;smoking/ tobacco exposure Depression/mood screen:      11/13/2023    2:55 PM  Depression screen PHQ 2/9  Decreased Interest 0  Down, Depressed, Hopeless 0  PHQ - 2 Score 0    ADLs:     11/13/2023    2:54 PM  In your present state of health, do you have any difficulty performing the following activities:  Hearing? 1  Vision? 0  Difficulty concentrating or making decisions? 0  Walking or climbing stairs? 0  Dressing or bathing? 0  Doing errands, shopping? 0  Preparing Food and eating ? N  Using the Toilet? N  In the past six months, have you accidently leaked urine? N  Do you have problems with loss of bowel control? N  Managing your Medications? N  Managing your Finances? N  Housekeeping or managing your Housekeeping? N     Cognitive Testing  Alert? Yes  Normal Appearance?Yes  Oriented to person? Yes  Place? Yes   Time? Yes  Recall of  three objects?  Yes  Can perform simple calculations? Yes  Displays appropriate judgment?Yes  Can read the correct time from a watch face?Yes  EOL planning: Does Patient Have a Medical Advance Directive?: No Would patient like information on creating a medical advance directive?: No - Patient declined    Review of Systems  Constitutional: Negative.  Negative for chills, fever and weight loss.  HENT: Negative.  Negative for congestion and hearing loss.   Eyes: Negative.  Negative for blurred vision and double vision.  Respiratory: Negative.  Negative for cough and shortness of breath.   Cardiovascular: Negative.  Negative for chest pain, palpitations, orthopnea and leg swelling.  Gastrointestinal: Negative.  Negative for abdominal pain, constipation, diarrhea, heartburn, nausea and vomiting.  Genitourinary: Negative.  Negative for dysuria, flank pain, frequency, hematuria and urgency.       Gets up 1-2 times a night to urinate  Musculoskeletal:  Negative for back pain, falls,  joint pain and myalgias.  Skin: Negative.  Negative for rash.  Neurological: Negative.  Negative for dizziness, tingling, tremors, loss of consciousness and headaches.  Endo/Heme/Allergies: Negative.   Psychiatric/Behavioral: Negative.  Negative for depression, memory loss and suicidal ideas.       Objective:     Blood pressure 128/68, pulse (!) 55, temperature 97.9 F (36.6 C), height 5\' 6"  (1.676 m), weight 133 lb 3.2 oz (60.4 kg), SpO2 99%. Body mass index is 21.5 kg/m.  General appearance: alert, no distress, WD/WN, male HEENT: normocephalic, sclerae anicteric, TMs pearly, nares patent, no discharge or erythema, pharynx normal, decreased hearing bilateral- no hearing aids today Oral cavity: MMM, no lesions Neck: supple, no lymphadenopathy, no thyromegaly, no masses Heart: RRR, normal S1, S2, no murmurs Lungs: CTA bilaterally, no wheezes, rhonchi, or rales Abdomen: +bs, soft, non tender, non distended, no masses, no hepatomegaly, no splenomegaly, + left inguinal hernia, easily retractable. Musculoskeletal: non tender, good distal reflexes, strength and pulses, no swelling, no obvious deformity Extremities: no edema, no cyanosis, no clubbing Pulses: 2+ symmetric, upper and lower extremities, normal cap refill Neurological: alert, oriented x 3, CN2-12 intact, strength normal upper extremities and lower extremities, sensation normal throughout, DTRs 2+ throughout, no cerebellar signs, gait normal, Skin: several erythematous, scaly plaques on arms Psychiatric: normal affect, behavior normal, pleasant       [facility]  11/13/2023

## 2023-11-13 ENCOUNTER — Encounter: Payer: Self-pay | Admitting: Nurse Practitioner

## 2023-11-13 ENCOUNTER — Ambulatory Visit (INDEPENDENT_AMBULATORY_CARE_PROVIDER_SITE_OTHER): Payer: Medicare Other | Admitting: Nurse Practitioner

## 2023-11-13 VITALS — BP 128/68 | HR 55 | Temp 97.9°F | Ht 66.0 in | Wt 133.2 lb

## 2023-11-13 DIAGNOSIS — R001 Bradycardia, unspecified: Secondary | ICD-10-CM

## 2023-11-13 DIAGNOSIS — Z0001 Encounter for general adult medical examination with abnormal findings: Secondary | ICD-10-CM

## 2023-11-13 DIAGNOSIS — I10 Essential (primary) hypertension: Secondary | ICD-10-CM

## 2023-11-13 DIAGNOSIS — R7309 Other abnormal glucose: Secondary | ICD-10-CM | POA: Diagnosis not present

## 2023-11-13 DIAGNOSIS — R6889 Other general symptoms and signs: Secondary | ICD-10-CM | POA: Diagnosis not present

## 2023-11-13 DIAGNOSIS — E559 Vitamin D deficiency, unspecified: Secondary | ICD-10-CM | POA: Diagnosis not present

## 2023-11-13 DIAGNOSIS — N138 Other obstructive and reflux uropathy: Secondary | ICD-10-CM | POA: Diagnosis not present

## 2023-11-13 DIAGNOSIS — Z79899 Other long term (current) drug therapy: Secondary | ICD-10-CM

## 2023-11-13 DIAGNOSIS — E785 Hyperlipidemia, unspecified: Secondary | ICD-10-CM | POA: Diagnosis not present

## 2023-11-13 DIAGNOSIS — N401 Enlarged prostate with lower urinary tract symptoms: Secondary | ICD-10-CM

## 2023-11-13 DIAGNOSIS — K219 Gastro-esophageal reflux disease without esophagitis: Secondary | ICD-10-CM | POA: Diagnosis not present

## 2023-11-13 DIAGNOSIS — Z0000 Encounter for general adult medical examination without abnormal findings: Secondary | ICD-10-CM

## 2023-11-13 NOTE — Patient Instructions (Signed)

## 2023-11-14 LAB — CBC WITH DIFFERENTIAL/PLATELET
Absolute Lymphocytes: 1260 {cells}/uL (ref 850–3900)
Absolute Monocytes: 449 {cells}/uL (ref 200–950)
Basophils Absolute: 61 {cells}/uL (ref 0–200)
Basophils Relative: 1.2 %
Eosinophils Absolute: 209 {cells}/uL (ref 15–500)
Eosinophils Relative: 4.1 %
HCT: 44.2 % (ref 38.5–50.0)
Hemoglobin: 14.3 g/dL (ref 13.2–17.1)
MCH: 28.9 pg (ref 27.0–33.0)
MCHC: 32.4 g/dL (ref 32.0–36.0)
MCV: 89.3 fL (ref 80.0–100.0)
MPV: 11.2 fL (ref 7.5–12.5)
Monocytes Relative: 8.8 %
Neutro Abs: 3121 {cells}/uL (ref 1500–7800)
Neutrophils Relative %: 61.2 %
Platelets: 201 10*3/uL (ref 140–400)
RBC: 4.95 10*6/uL (ref 4.20–5.80)
RDW: 11.8 % (ref 11.0–15.0)
Total Lymphocyte: 24.7 %
WBC: 5.1 10*3/uL (ref 3.8–10.8)

## 2023-11-14 LAB — COMPLETE METABOLIC PANEL WITH GFR
AG Ratio: 1.6 (calc) (ref 1.0–2.5)
ALT: 10 U/L (ref 9–46)
AST: 18 U/L (ref 10–35)
Albumin: 3.9 g/dL (ref 3.6–5.1)
Alkaline phosphatase (APISO): 85 U/L (ref 35–144)
BUN: 18 mg/dL (ref 7–25)
CO2: 32 mmol/L (ref 20–32)
Calcium: 9.3 mg/dL (ref 8.6–10.3)
Chloride: 102 mmol/L (ref 98–110)
Creat: 1.03 mg/dL (ref 0.70–1.22)
Globulin: 2.4 g/dL (ref 1.9–3.7)
Glucose, Bld: 117 mg/dL — ABNORMAL HIGH (ref 65–99)
Potassium: 4.2 mmol/L (ref 3.5–5.3)
Sodium: 140 mmol/L (ref 135–146)
Total Bilirubin: 0.4 mg/dL (ref 0.2–1.2)
Total Protein: 6.3 g/dL (ref 6.1–8.1)
eGFR: 73 mL/min/{1.73_m2} (ref >=60)

## 2023-11-14 LAB — LIPID PANEL
Cholesterol: 181 mg/dL (ref <200)
HDL: 59 mg/dL (ref >=40)
LDL Cholesterol (Calc): 104 mg/dL — ABNORMAL HIGH
Non-HDL Cholesterol (Calc): 122 mg/dL (ref <130)
Total CHOL/HDL Ratio: 3.1 (calc) (ref <5.0)
Triglycerides: 90 mg/dL (ref <150)

## 2023-11-14 LAB — MAGNESIUM: Magnesium: 2 mg/dL (ref 1.5–2.5)

## 2024-01-25 ENCOUNTER — Encounter: Payer: Self-pay | Admitting: *Deleted

## 2024-08-08 ENCOUNTER — Encounter: Payer: Medicare Other | Admitting: Nurse Practitioner

## 2024-11-13 ENCOUNTER — Ambulatory Visit: Payer: Medicare Other | Admitting: Nurse Practitioner
